# Patient Record
Sex: Female | Born: 1970 | Race: White | Hispanic: No | Smoking: Never smoker
Health system: Southern US, Community
[De-identification: ages and names within clinical notes are randomized; demographics above are authoritative.]

## PROBLEM LIST (undated history)

## (undated) DIAGNOSIS — K219 Gastro-esophageal reflux disease without esophagitis: Secondary | ICD-10-CM

## (undated) DIAGNOSIS — J45909 Unspecified asthma, uncomplicated: Secondary | ICD-10-CM

## (undated) DIAGNOSIS — G8929 Other chronic pain: Secondary | ICD-10-CM

## (undated) DIAGNOSIS — M25569 Pain in unspecified knee: Secondary | ICD-10-CM

## (undated) DIAGNOSIS — F319 Bipolar disorder, unspecified: Secondary | ICD-10-CM

## (undated) DIAGNOSIS — B009 Herpesviral infection, unspecified: Secondary | ICD-10-CM

## (undated) DIAGNOSIS — K259 Gastric ulcer, unspecified as acute or chronic, without hemorrhage or perforation: Secondary | ICD-10-CM

## (undated) DIAGNOSIS — M549 Dorsalgia, unspecified: Secondary | ICD-10-CM

## (undated) DIAGNOSIS — G43909 Migraine, unspecified, not intractable, without status migrainosus: Secondary | ICD-10-CM

## (undated) DIAGNOSIS — F419 Anxiety disorder, unspecified: Secondary | ICD-10-CM

## (undated) DIAGNOSIS — Z87440 Personal history of urinary (tract) infections: Secondary | ICD-10-CM

## (undated) HISTORY — PX: TUBAL LIGATION: SHX77

## (undated) HISTORY — PX: CHOLECYSTECTOMY: SHX55

## (undated) HISTORY — PX: KNEE SURGERY: SHX244

## (undated) HISTORY — PX: ABDOMINAL HYSTERECTOMY: SHX81

## (undated) HISTORY — PX: CERVICAL CONE BIOPSY: SUR198

## (undated) HISTORY — PX: INSERTION OF MESH: SHX5868

---

## 2015-01-30 ENCOUNTER — Emergency Department (HOSPITAL_COMMUNITY): Payer: Medicare Other

## 2015-01-30 ENCOUNTER — Encounter (HOSPITAL_COMMUNITY): Payer: Self-pay | Admitting: *Deleted

## 2015-01-30 ENCOUNTER — Emergency Department (HOSPITAL_COMMUNITY)
Admission: EM | Admit: 2015-01-30 | Discharge: 2015-01-30 | Disposition: A | Discharge status: Home / Self Care | Payer: Medicare Other | Attending: Emergency Medicine | Admitting: Emergency Medicine

## 2015-01-30 DIAGNOSIS — R079 Chest pain, unspecified: Secondary | ICD-10-CM | POA: Insufficient documentation

## 2015-01-30 DIAGNOSIS — Z880 Allergy status to penicillin: Secondary | ICD-10-CM | POA: Insufficient documentation

## 2015-01-30 DIAGNOSIS — Z8719 Personal history of other diseases of the digestive system: Secondary | ICD-10-CM | POA: Insufficient documentation

## 2015-01-30 DIAGNOSIS — Z8659 Personal history of other mental and behavioral disorders: Secondary | ICD-10-CM | POA: Insufficient documentation

## 2015-01-30 DIAGNOSIS — G8929 Other chronic pain: Secondary | ICD-10-CM | POA: Insufficient documentation

## 2015-01-30 DIAGNOSIS — Z8679 Personal history of other diseases of the circulatory system: Secondary | ICD-10-CM | POA: Diagnosis not present

## 2015-01-30 DIAGNOSIS — J45901 Unspecified asthma with (acute) exacerbation: Secondary | ICD-10-CM | POA: Insufficient documentation

## 2015-01-30 DIAGNOSIS — Z8619 Personal history of other infectious and parasitic diseases: Secondary | ICD-10-CM | POA: Insufficient documentation

## 2015-01-30 DIAGNOSIS — R42 Dizziness and giddiness: Secondary | ICD-10-CM | POA: Insufficient documentation

## 2015-01-30 HISTORY — DX: Dorsalgia, unspecified: M54.9

## 2015-01-30 HISTORY — DX: Other chronic pain: G89.29

## 2015-01-30 HISTORY — DX: Gastro-esophageal reflux disease without esophagitis: K21.9

## 2015-01-30 HISTORY — DX: Unspecified asthma, uncomplicated: J45.909

## 2015-01-30 HISTORY — DX: Pain in unspecified knee: M25.569

## 2015-01-30 HISTORY — DX: Anxiety disorder, unspecified: F41.9

## 2015-01-30 HISTORY — DX: Bipolar disorder, unspecified: F31.9

## 2015-01-30 HISTORY — DX: Herpesviral infection, unspecified: B00.9

## 2015-01-30 HISTORY — DX: Migraine, unspecified, not intractable, without status migrainosus: G43.909

## 2015-01-30 LAB — RAPID URINE DRUG SCREEN, HOSP PERFORMED
Amphetamines: NOT DETECTED
Barbiturates: NOT DETECTED
Benzodiazepines: NOT DETECTED
COCAINE: NOT DETECTED
OPIATES: NOT DETECTED
Tetrahydrocannabinol: NOT DETECTED

## 2015-01-30 LAB — CBC WITH DIFFERENTIAL/PLATELET
BASOS ABS: 0 10*3/uL (ref 0.0–0.1)
Basophils Relative: 0 % (ref 0–1)
EOS ABS: 0.1 10*3/uL (ref 0.0–0.7)
EOS PCT: 1 % (ref 0–5)
HCT: 41.9 % (ref 36.0–46.0)
Hemoglobin: 14.3 g/dL (ref 12.0–15.0)
Lymphocytes Relative: 27 % (ref 12–46)
Lymphs Abs: 1.8 10*3/uL (ref 0.7–4.0)
MCH: 31.3 pg (ref 26.0–34.0)
MCHC: 34.1 g/dL (ref 30.0–36.0)
MCV: 91.7 fL (ref 78.0–100.0)
MONO ABS: 0.7 10*3/uL (ref 0.1–1.0)
Monocytes Relative: 10 % (ref 3–12)
Neutro Abs: 4.1 10*3/uL (ref 1.7–7.7)
Neutrophils Relative %: 62 % (ref 43–77)
PLATELETS: 278 10*3/uL (ref 150–400)
RBC: 4.57 MIL/uL (ref 3.87–5.11)
RDW: 13.6 % (ref 11.5–15.5)
WBC: 6.7 10*3/uL (ref 4.0–10.5)

## 2015-01-30 LAB — COMPREHENSIVE METABOLIC PANEL
ALT: 19 U/L (ref 14–54)
ANION GAP: 9 (ref 5–15)
AST: 17 U/L (ref 15–41)
Albumin: 3.7 g/dL (ref 3.5–5.0)
Alkaline Phosphatase: 37 U/L — ABNORMAL LOW (ref 38–126)
BUN: 10 mg/dL (ref 6–20)
CALCIUM: 8.5 mg/dL — AB (ref 8.9–10.3)
CO2: 25 mmol/L (ref 22–32)
CREATININE: 0.72 mg/dL (ref 0.44–1.00)
Chloride: 103 mmol/L (ref 101–111)
GFR calc non Af Amer: >60 mL/min (ref >60)
Glucose, Bld: 89 mg/dL (ref 65–99)
Potassium: 3.6 mmol/L (ref 3.5–5.1)
Sodium: 137 mmol/L (ref 135–145)
TOTAL PROTEIN: 7 g/dL (ref 6.5–8.1)
Total Bilirubin: 0.6 mg/dL (ref 0.3–1.2)

## 2015-01-30 LAB — TROPONIN I: Troponin I: <0.03 ng/mL (ref <0.031)

## 2015-01-30 LAB — ETHANOL: Alcohol, Ethyl (B): <5 mg/dL (ref <5)

## 2015-01-30 NOTE — ED Notes (Addendum)
Pt c/o mid center chest pain that started yesterday after taking her dog outside, pt also c/o left side sharp chest pain that started over a month ago, pt reports that the pain is associated with sob, clammy feeling, pt states that she has had the sharp chest pain before and was placed on something for anxiety,

## 2015-01-30 NOTE — ED Provider Notes (Signed)
CSN: [id number]     Arrival date & time 01/30/15  2105 History  This chart was scribed for Zadie Rhine, MD by Octavia Heir, ED Scribe. This patient was seen in room APA15/APA15 and the patient's care was started at 10:35 PM.    Chief Complaint  Patient presents with  . Chest Pain    Patient is a 44 y.o. female presenting with chest pain. The history is provided by the patient. No language interpreter was used.  Chest Pain Pain location:  L chest Pain quality: shooting   Pain radiates to:  Does not radiate Pain radiates to the back: no   Pain severity:  Moderate Onset quality:  Gradual Duration:  2 months Timing:  Intermittent Progression:  Worsening Chronicity:  Recurrent Context: breathing   Relieved by:  Nothing Worsened by:  Deep breathing and exertion Ineffective treatments:  None tried Associated symptoms: anxiety, dizziness and shortness of breath   Associated symptoms: no fever and not vomiting     HPI Comments: Melanie Proctor is a 44 y.o. female who has a hx of anxiety presents to the Emergency Department complaining of chest pain onset 2 months ago. She notes having an associated cough, sob while ambulating, and light-headedness for 2 months. Pt also reports walking her dog yesterday and pulling her chest yesterday when he pulled away from her and this caused CP. She notes she has anxiety. She notes the last time she was seen for these symptoms was a few months ago. Pt notes having thoughts about hurting herself but has an appointment with a psychiatrist tomorrow. She denies SI and denies having plan at this time.  Pt denies fever, recent travel, vomiting, pain or swelling in legs, hx of MI, hx of blood clots.   Past Medical History  Diagnosis Date  . Anxiety   . GERD (gastroesophageal reflux disease)   . Migraine   . Herpes   . Back pain   . Asthma   . Knee pain, chronic   . Bipolar disorder    Past Surgical History  Procedure Laterality Date  . Knee surgery     . Cervical cone biopsy    . Insertion of mesh      vaginal  . Tubal ligation    . Abdominal hysterectomy    . Cholecystectomy     No family history on file. History  Substance Use Topics  . Smoking status: Never Smoker   . Smokeless tobacco: Not on file  . Alcohol Use: Yes     Comment: ocassional    OB History    No data available     Review of Systems  Constitutional: Negative for fever.  Respiratory: Positive for shortness of breath.   Cardiovascular: Positive for chest pain.  Gastrointestinal: Negative for vomiting.  Neurological: Positive for dizziness and light-headedness.  All other systems reviewed and are negative.     Allergies  Other; Penicillins; Sulfa antibiotics; Tape; and Topamax  Home Medications   Prior to Admission medications   Not on File   Triage vitals: BP 109/87 mmHg  Pulse 89  Temp(Src) 98.3 F (36.8 C) (Oral)  Resp 16  Ht  (1.626 m)  Wt 132 lb (59.875 kg)  BMI 22.65 kg/m2  SpO2 99% Physical Exam CONSTITUTIONAL: Well developed/well nourished HEAD: Normocephalic/atraumatic EYES: EOMI/PERRL ENMT: Mucous membranes moist NECK: supple no meningeal signs SPINE/BACK:entire spine nontender CV: S1/S2 noted, no murmurs/rubs/gallops noted Chest: left sided chest wall tenderness LUNGS: Lungs are clear to auscultation bilaterally,  no apparent distress ABDOMEN: soft, nontender, no rebound or guarding, bowel sounds noted throughout abdomen GU:no cva tenderness NEURO: Pt is awake/alert/appropriate, moves all extremitiesx4.  No facial droop.   EXTREMITIES: pulses normal/equal, full ROM, no LE edema or tenderness SKIN: warm, color normal PSYCH: no abnormalities of mood noted, alert and oriented to situation  ED Course  Procedures  DIAGNOSTIC STUDIES: Oxygen Saturation is 99% on RA, normal by my interpretation.  COORDINATION OF CARE:  10:43 PM Discussed treatment plan which includes follow up tomorrow with counselor with pt at bedside  and pt agreed to plan.   Pt admits this CP has been constant for 2 months, and she had reproducible CP on exam She is low risk for ACS I have low suspicion for PE (no hypoxia/tachycardia) I feel she can f/u as outpatient We discussed strict return precautions Also she denies SI and has f/u tomorrow  Labs Review Labs Reviewed  COMPREHENSIVE METABOLIC PANEL - Abnormal; Notable for the following:    Calcium 8.5 (*)    Alkaline Phosphatase 37 (*)    All other components within normal limits  CBC WITH DIFFERENTIAL/PLATELET  TROPONIN I  ETHANOL  URINE RAPID DRUG SCREEN, HOSP PERFORMED    Imaging Review Dg Chest 2 View  01/30/2015   CLINICAL DATA:  Patient with anterior chest pain after being pulled by dog.  EXAM: CHEST  2 VIEW  COMPARISON:  None.  FINDINGS: Monitoring leads overlie the patient. Normal cardiac and mediastinal contours. No consolidative pulmonary opacities. No pleural effusion or pneumothorax. Cholecystectomy clips. Regional skeleton is unremarkable.  IMPRESSION: No acute cardiopulmonary process.   Electronically Signed   By: Annia Belt M.D.   On: 01/30/2015 22:14     EKG Interpretation   Date/Time:  Wednesday January 30 2015 21:13:22 EDT Ventricular Rate:  87 PR Interval:  145 QRS Duration: 83 QT Interval:  374 QTC Calculation: 450 R Axis:   70 Text Interpretation:  Sinus rhythm No previous ECGs available Confirmed by  Manus Gunning  MD, STEPHEN [id number]) on 01/30/2015 9:34:35 PM      MDM   Final diagnoses:  Chest pain, unspecified chest pain type    Nursing notes including past medical history and social history reviewed and considered in documentation xrays/imaging reviewed by myself and considered during evaluation Labs/vital reviewed myself and considered during evaluation   I personally performed the services described in this documentation, which was scribed in my presence. The recorded information has been reviewed and is accurate.     Zadie Rhine,  MD 01/30/15 [id number]

## 2015-01-30 NOTE — Discharge Instructions (Signed)

## 2015-01-30 NOTE — ED Notes (Signed)
Pt states the a few days ago she had wanted to hurt herself, did have a plan of cutting herself, denies any SI or HI now, pt states that she was just angry, does have an appointment with [facility] tomorrow,

## 2015-02-22 ENCOUNTER — Emergency Department (HOSPITAL_COMMUNITY)
Admission: EM | Admit: 2015-02-22 | Discharge: 2015-02-22 | Disposition: A | Discharge status: Home / Self Care | Payer: Medicare Other | Attending: Emergency Medicine | Admitting: Emergency Medicine

## 2015-02-22 ENCOUNTER — Encounter (HOSPITAL_COMMUNITY): Payer: Self-pay | Admitting: Emergency Medicine

## 2015-02-22 DIAGNOSIS — F419 Anxiety disorder, unspecified: Secondary | ICD-10-CM | POA: Diagnosis not present

## 2015-02-22 DIAGNOSIS — Z793 Long term (current) use of hormonal contraceptives: Secondary | ICD-10-CM | POA: Diagnosis not present

## 2015-02-22 DIAGNOSIS — K219 Gastro-esophageal reflux disease without esophagitis: Secondary | ICD-10-CM | POA: Diagnosis not present

## 2015-02-22 DIAGNOSIS — F319 Bipolar disorder, unspecified: Secondary | ICD-10-CM | POA: Insufficient documentation

## 2015-02-22 DIAGNOSIS — R300 Dysuria: Secondary | ICD-10-CM | POA: Diagnosis present

## 2015-02-22 DIAGNOSIS — Z880 Allergy status to penicillin: Secondary | ICD-10-CM | POA: Diagnosis not present

## 2015-02-22 DIAGNOSIS — G43909 Migraine, unspecified, not intractable, without status migrainosus: Secondary | ICD-10-CM | POA: Insufficient documentation

## 2015-02-22 DIAGNOSIS — N3091 Cystitis, unspecified with hematuria: Secondary | ICD-10-CM | POA: Insufficient documentation

## 2015-02-22 DIAGNOSIS — Z8619 Personal history of other infectious and parasitic diseases: Secondary | ICD-10-CM | POA: Insufficient documentation

## 2015-02-22 DIAGNOSIS — J45909 Unspecified asthma, uncomplicated: Secondary | ICD-10-CM | POA: Insufficient documentation

## 2015-02-22 DIAGNOSIS — Z79899 Other long term (current) drug therapy: Secondary | ICD-10-CM | POA: Insufficient documentation

## 2015-02-22 DIAGNOSIS — G8929 Other chronic pain: Secondary | ICD-10-CM | POA: Diagnosis not present

## 2015-02-22 LAB — URINALYSIS, ROUTINE W REFLEX MICROSCOPIC
Bilirubin Urine: NEGATIVE
GLUCOSE, UA: NEGATIVE mg/dL
Ketones, ur: NEGATIVE mg/dL
Nitrite: NEGATIVE
PROTEIN: NEGATIVE mg/dL
Urobilinogen, UA: 0.2 mg/dL (ref 0.0–1.0)
pH: 5.5 (ref 5.0–8.0)

## 2015-02-22 LAB — URINE MICROSCOPIC-ADD ON

## 2015-02-22 MED ORDER — PHENAZOPYRIDINE HCL 100 MG PO TABS
200.0000 mg | ORAL_TABLET | Freq: Once | ORAL | Status: AC
  Administered 2015-02-22: 200 mg via ORAL
  Filled 2015-02-22: qty 2

## 2015-02-22 MED ORDER — PHENAZOPYRIDINE HCL 200 MG PO TABS: 200.0000 mg | ORAL_TABLET | Freq: Three times a day (TID) | ORAL | Status: DC

## 2015-02-22 MED ORDER — CEPHALEXIN 500 MG PO CAPS: 500.0000 mg | ORAL_CAPSULE | Freq: Four times a day (QID) | ORAL | Status: DC

## 2015-02-22 MED ORDER — CEPHALEXIN 500 MG PO CAPS
500.0000 mg | ORAL_CAPSULE | Freq: Once | ORAL | Status: AC
Start: 2015-02-22 — End: 2015-02-22
  Administered 2015-02-22: 500 mg via ORAL
  Filled 2015-02-22: qty 1

## 2015-02-22 NOTE — ED Provider Notes (Signed)
CSN: [id number]     Arrival date & time 02/22/15  1441 History   First MD Initiated Contact with Patient 02/22/15 1612     Chief Complaint  Patient presents with  . Dysuria     (Consider location/radiation/quality/duration/timing/severity/associated sxs/prior Treatment) Patient is a 44 y.o. female presenting with dysuria. The history is provided by the patient.  Dysuria Quality: pressure. Pain severity:  Moderate Onset quality:  Gradual Duration:  6 hours Timing:  Constant Progression:  Worsening Chronicity:  New Recent urinary tract infections: yes   Relieved by:  None tried Worsened by:  Nothing tried Ineffective treatments:  None tried Urinary symptoms: discolored urine, frequent urination and hematuria   Urinary symptoms: no bladder incontinence   Associated symptoms: abdominal pain   Associated symptoms: no fever, no genital lesions, no nausea and no vomiting    Melanie Proctor is a 44 y.o. female who presents to the ED with UTI symptoms that started earlier today. She has had UTI's in the past with the last one 2 months ago and this feels similar. She has lower abdominal pressure.  She has had a hysterectomy.   Past Medical History  Diagnosis Date  . Anxiety   . GERD (gastroesophageal reflux disease)   . Migraine   . Herpes   . Back pain   . Asthma   . Knee pain, chronic   . Bipolar disorder    Past Surgical History  Procedure Laterality Date  . Knee surgery    . Cervical cone biopsy    . Insertion of mesh      vaginal  . Tubal ligation    . Abdominal hysterectomy    . Cholecystectomy     History reviewed. No pertinent family history. History  Substance Use Topics  . Smoking status: Never Smoker   . Smokeless tobacco: Not on file  . Alcohol Use: Yes     Comment: ocassional    OB History    No data available     Review of Systems  Constitutional: Negative for fever.  Gastrointestinal: Positive for abdominal pain. Negative for nausea and vomiting.   Genitourinary: Positive for dysuria.  All other systems negative    Allergies  Other; Topamax; Penicillins; Sulfa antibiotics; and Tape  Home Medications   Prior to Admission medications   Medication Sig Start Date End Date Taking? Authorizing Provider  cephALEXin (KEFLEX) 500 MG capsule Take 1 capsule (500 mg total) by mouth 4 (four) times daily. 02/22/15   Hope Orlene OchM Neese, NP  citalopram (CELEXA) 20 MG tablet Take 10-20 mg by mouth daily. 01/03/15   Historical Provider, MD  clonazePAM (KLONOPIN) 0.5 MG tablet Take 0.5 mg by mouth 2 (two) times daily. 01/03/15   Historical Provider, MD  HYDROcodone-acetaminophen (NORCO/VICODIN) 5-325 MG per tablet Take 1 tablet by mouth every 8 (eight) hours as needed. for pain 01/11/15   Historical Provider, MD  ketoconazole (NIZORAL) 2 % shampoo Apply 1 application topically every other day. 01/11/15   Historical Provider, MD  Levonorgestrel-Ethinyl Estradiol (AMETHIA,CAMRESE) 0.1-0.02 & 0.01 MG tablet Take 1 tablet by mouth every morning. 01/03/15   Historical Provider, MD  minocycline (MINOCIN,DYNACIN) 100 MG capsule Take 100 mg by mouth once as needed (FOR ACNE).    Historical Provider, MD  omeprazole (PRILOSEC) 20 MG capsule Take 20 mg by mouth daily. 01/03/15   Historical Provider, MD  phenazopyridine (PYRIDIUM) 200 MG tablet Take 1 tablet (200 mg total) by mouth 3 (three) times daily. 02/22/15   Hope M  Damian Leavell, NP  rizatriptan (MAXALT) 10 MG tablet Take 10 mg by mouth as needed for migraine.  01/11/15   Historical Provider, MD  valACYclovir (VALTREX) 1000 MG tablet Take 1 g by mouth daily. 01/03/15   Historical Provider, MD   BP 116/72 mmHg  Pulse 86  Temp(Src) 98.4 F (36.9 C) (Oral)  Resp 14  Ht  (1.626 m)  Wt 129 lb (58.514 kg)  BMI 22.13 kg/m2  SpO2 100% Physical Exam  Constitutional: She is oriented to person, place, and time. She appears well-developed and well-nourished. No distress.  HENT:  Head: Normocephalic and atraumatic.  Eyes:  Conjunctivae and EOM are normal.  Neck: Normal range of motion. Neck supple.  Cardiovascular: Normal rate and regular rhythm.   Pulmonary/Chest: Effort normal and breath sounds normal.  Abdominal: Soft. Bowel sounds are normal. There is tenderness in the suprapubic area. There is no CVA tenderness.  Musculoskeletal: Normal range of motion.  Neurological: She is alert and oriented to person, place, and time. No cranial nerve deficit.  Skin: Skin is warm and dry.  Psychiatric: She has a normal mood and affect. Her behavior is normal.  Nursing note and vitals reviewed.   ED Course  Procedures (including critical care time) Labs Review Results for orders placed or performed during the hospital encounter of 02/22/15 (from the past 24 hour(s))  Urinalysis, Routine w reflex microscopic (not at [facility])     Status: Abnormal   Collection Time: 02/22/15  3:00 PM  Result Value Ref Range   Color, Urine YELLOW YELLOW   APPearance CLEAR CLEAR   Specific Gravity, Urine <1.005 (L) 1.005 - 1.030   pH 5.5 5.0 - 8.0   Glucose, UA NEGATIVE NEGATIVE mg/dL   Hgb urine dipstick LARGE (A) NEGATIVE   Bilirubin Urine NEGATIVE NEGATIVE   Ketones, ur NEGATIVE NEGATIVE mg/dL   Protein, ur NEGATIVE NEGATIVE mg/dL   Urobilinogen, UA 0.2 0.0 - 1.0 mg/dL   Nitrite NEGATIVE NEGATIVE   Leukocytes, UA MODERATE (A) NEGATIVE  Urine microscopic-add on     Status: Abnormal   Collection Time: 02/22/15  3:00 PM  Result Value Ref Range   Squamous Epithelial / LPF FEW (A) RARE   WBC, UA 21-50 <3 WBC/hpf   RBC / HPF 11-20 <3 RBC/hpf   Bacteria, UA FEW (A) RARE     MDM  44 y.o. female with UTI symptoms that started earlier today. Will treat with antibiotics and Pyridium. Stable for d/c without fever or signs of pyelo. She does not appear toxic. Discussed with the patient clinical and lab findings and all questioned fully answered. She will return if any problems arise.  Final diagnoses:  Cystitis with hematuria        [name], NP 02/22/15 1643  Benjiman Core, MD 02/23/15 [phone]

## 2015-02-22 NOTE — ED Notes (Signed)
Patient complaining of burning with urination and blood in urine starting this morning. Also complaining of lower abdominal pain.

## 2015-02-22 NOTE — Discharge Instructions (Signed)

## 2015-02-24 LAB — URINE CULTURE: Culture: 5000

## 2015-02-25 ENCOUNTER — Telehealth (HOSPITAL_BASED_OUTPATIENT_CLINIC_OR_DEPARTMENT_OTHER): Payer: Self-pay | Admitting: Emergency Medicine

## 2015-02-25 ENCOUNTER — Encounter: Payer: Medicare Other | Admitting: Cardiology

## 2015-02-25 NOTE — Telephone Encounter (Signed)
Post ED Visit - Positive Culture Follow-up  Culture report reviewed by antimicrobial stewardship pharmacist: []  Wes Dulaney, Pharm.D., BCPS [x]  Celedonio MiyamotoJeremy Frens, Pharm.D., BCPS []  Georgina PillionElizabeth Martin, Pharm.D., BCPS []  AmesMinh Pham, [address]Ph[address] BCPS, AAHIVP []  Estella HuskMichelle Turner, Pharm.D., BCPS, AAHIVP []  Elder CyphersLorie Poole, [address]Ph[address]D., BCPS  Positive urine culture Strep Treated with cephalexin, organism sensitive to the same and no further patient follow-up is required at this time.  Berle MullMiller, Rimsha Trembley 02/25/2015, 11:16 AM

## 2015-02-25 NOTE — Progress Notes (Signed)
Patient ID: Melanie Proctor, female   DOB: [birth year], 44 y.o.   MRN: [id number]     ERROR

## 2015-04-01 ENCOUNTER — Encounter (HOSPITAL_COMMUNITY): Payer: Self-pay | Admitting: *Deleted

## 2015-04-01 ENCOUNTER — Emergency Department (HOSPITAL_COMMUNITY)
Admission: EM | Admit: 2015-04-01 | Discharge: 2015-04-01 | Disposition: A | Discharge status: Home / Self Care | Payer: Medicare Other | Attending: Emergency Medicine | Admitting: Emergency Medicine

## 2015-04-01 DIAGNOSIS — K219 Gastro-esophageal reflux disease without esophagitis: Secondary | ICD-10-CM | POA: Insufficient documentation

## 2015-04-01 DIAGNOSIS — Z880 Allergy status to penicillin: Secondary | ICD-10-CM | POA: Diagnosis not present

## 2015-04-01 DIAGNOSIS — Y9389 Activity, other specified: Secondary | ICD-10-CM | POA: Diagnosis not present

## 2015-04-01 DIAGNOSIS — S3992XA Unspecified injury of lower back, initial encounter: Secondary | ICD-10-CM | POA: Diagnosis present

## 2015-04-01 DIAGNOSIS — Z8619 Personal history of other infectious and parasitic diseases: Secondary | ICD-10-CM | POA: Insufficient documentation

## 2015-04-01 DIAGNOSIS — F419 Anxiety disorder, unspecified: Secondary | ICD-10-CM | POA: Insufficient documentation

## 2015-04-01 DIAGNOSIS — S39012A Strain of muscle, fascia and tendon of lower back, initial encounter: Secondary | ICD-10-CM | POA: Diagnosis not present

## 2015-04-01 DIAGNOSIS — Y9289 Other specified places as the place of occurrence of the external cause: Secondary | ICD-10-CM | POA: Diagnosis not present

## 2015-04-01 DIAGNOSIS — G8929 Other chronic pain: Secondary | ICD-10-CM | POA: Insufficient documentation

## 2015-04-01 DIAGNOSIS — Y998 Other external cause status: Secondary | ICD-10-CM | POA: Insufficient documentation

## 2015-04-01 DIAGNOSIS — X58XXXA Exposure to other specified factors, initial encounter: Secondary | ICD-10-CM | POA: Diagnosis not present

## 2015-04-01 DIAGNOSIS — G43909 Migraine, unspecified, not intractable, without status migrainosus: Secondary | ICD-10-CM | POA: Diagnosis not present

## 2015-04-01 DIAGNOSIS — F319 Bipolar disorder, unspecified: Secondary | ICD-10-CM | POA: Insufficient documentation

## 2015-04-01 DIAGNOSIS — J45909 Unspecified asthma, uncomplicated: Secondary | ICD-10-CM | POA: Diagnosis not present

## 2015-04-01 DIAGNOSIS — Z79899 Other long term (current) drug therapy: Secondary | ICD-10-CM | POA: Diagnosis not present

## 2015-04-01 MED ORDER — ONDANSETRON 4 MG PO TBDP
4.0000 mg | ORAL_TABLET | Freq: Once | ORAL | Status: AC
  Administered 2015-04-01: 4 mg via ORAL
  Filled 2015-04-01: qty 1

## 2015-04-01 MED ORDER — METHOCARBAMOL 500 MG PO TABS: 1000.0000 mg | ORAL_TABLET | Freq: Three times a day (TID) | ORAL | Status: DC | PRN

## 2015-04-01 MED ORDER — ONDANSETRON HCL 4 MG PO TABS: 4.0000 mg | ORAL_TABLET | Freq: Three times a day (TID) | ORAL | Status: DC | PRN

## 2015-04-01 MED ORDER — METHOCARBAMOL 500 MG PO TABS
1000.0000 mg | ORAL_TABLET | Freq: Once | ORAL | Status: AC
  Administered 2015-04-01: 1000 mg via ORAL
  Filled 2015-04-01: qty 2

## 2015-04-01 NOTE — Discharge Instructions (Signed)
Back Pain, Adult °Low back pain is very common. About 1 in 5 people have back pain. The cause of low back pain is rarely dangerous. The pain often gets better over time. About half of people with a sudden onset of back pain feel better in just 2 weeks. About 8 in 10 people feel better by 6 weeks.  °CAUSES °Some common causes of back pain include: °· Strain of the muscles or ligaments supporting the spine. °· Wear and tear (degeneration) of the spinal discs. °· Arthritis. °· Direct injury to the back. °DIAGNOSIS °Most of the time, the direct cause of low back pain is not known. However, back pain can be treated effectively even when the exact cause of the pain is unknown. Answering your caregiver's questions about your overall health and symptoms is one of the most accurate ways to make sure the cause of your pain is not dangerous. If your caregiver needs more information, he or she may order lab work or imaging tests (X-rays or MRIs). However, even if imaging tests show changes in your back, this usually does not require surgery. °HOME CARE INSTRUCTIONS °For many people, back pain returns. Since low back pain is rarely dangerous, it is often a condition that people can learn to manage on their own.  °· Remain active. It is stressful on the back to sit or stand in one place. Do not sit, drive, or stand in one place for more than 30 minutes at a time. Take short walks on level surfaces as soon as pain allows. Try to increase the length of time you walk each day. °· Do not stay in bed. Resting more than 1 or 2 days can delay your recovery. °· Do not avoid exercise or work. Your body is made to move. It is not dangerous to be active, even though your back may hurt. Your back will likely heal faster if you return to being active before your pain is gone. °· Pay attention to your body when you  bend and lift. Many people have less discomfort when lifting if they bend their knees, keep the load close to their bodies, and  avoid twisting. Often, the most comfortable positions are those that put less stress on your recovering back. °· Find a comfortable position to sleep. Use a firm mattress and lie on your side with your knees slightly bent. If you lie on your back, put a pillow under your knees. °· Only take over-the-counter or prescription medicines as directed by your caregiver. Over-the-counter medicines to reduce pain and inflammation are often the most helpful. Your caregiver may prescribe muscle relaxant drugs. These medicines help dull your pain so you can more quickly return to your normal activities and healthy exercise. °· Put ice on the injured area. °¨ Put ice in a plastic bag. °¨ Place a towel between your skin and the bag. °¨ Leave the ice on for 15-20 minutes, 03-04 times a day for the first 2 to 3 days. After that, ice and heat may be alternated to reduce pain and spasms. °· Ask your caregiver about trying back exercises and gentle massage. This may be of some benefit. °· Avoid feeling anxious or stressed. Stress increases muscle tension and can worsen back pain. It is important to recognize when you are anxious or stressed and learn ways to manage it. Exercise is a great option. °SEEK MEDICAL CARE IF: °· You have pain that is not relieved with rest or medicine. °· You have pain that does not improve in 1 week. °· You have new symptoms. °· You are generally not feeling well. °SEEK   IMMEDIATE MEDICAL CARE IF:  °· You have pain that radiates from your back into your legs. °· You develop new bowel or bladder control problems. °· You have unusual weakness or numbness in your arms or legs. °· You develop nausea or vomiting. °· You develop abdominal pain. °· You feel faint. °Document Released: 07/27/2005 Document Revised: 01/26/2012 Document Reviewed: 11/28/2013 °[employer]® Patient Information ©2015 [employer]. This information is not intended to replace advice given to you by your health care provider. Make sure you  discuss any questions you have with your health care provider. °Muscle Strain °A muscle strain is an injury that occurs when a muscle is stretched beyond its normal length. Usually a small number of muscle fibers are torn when this happens. Muscle strain is rated in degrees. First-degree strains have the least amount of muscle fiber tearing and pain. Second-degree and third-degree strains have increasingly more tearing and pain.  °Usually, recovery from muscle strain takes 1-2 weeks. Complete healing takes 5-6 weeks.  °CAUSES  °Muscle strain happens when a sudden, violent force placed on a muscle stretches it too far. This may occur with lifting, sports, or a fall.  °RISK FACTORS °Muscle strain is especially common in athletes.  °SIGNS AND SYMPTOMS °At the site of the muscle strain, there may be: °· Pain. °· Bruising. °· Swelling. °· Difficulty using the muscle due to pain or lack of normal function. °DIAGNOSIS  °Your health care provider will perform a physical exam and ask about your medical history. °TREATMENT  °Often, the best treatment for a muscle strain is resting, icing, and applying cold compresses to the injured area.   °HOME CARE INSTRUCTIONS  °· Use the PRICE method of treatment to promote muscle healing during the first 2-3 days after your injury. The PRICE method involves: °¨ Protecting the muscle from being injured again. °¨ Restricting your activity and resting the injured body part. °¨ Icing your injury. To do this, put ice in a plastic bag. Place a towel between your skin and the bag. Then, apply the ice and leave it on from 15-20 minutes each hour. After the third day, switch to moist heat packs. °¨ Apply compression to the injured area with a splint or elastic bandage. Be careful not to wrap it too tightly. This may interfere with blood circulation or increase swelling. °¨ Elevate the injured body part above the level of your heart as often as you can. °· Only take over-the-counter or  prescription medicines for pain, discomfort, or fever as directed by your health care provider. °· Warming up prior to exercise helps to prevent future muscle strains. °SEEK MEDICAL CARE IF:  °· You have increasing pain or swelling in the injured area. °· You have numbness, tingling, or a significant loss of strength in the injured area. °MAKE SURE YOU:  °· Understand these instructions. °· Will watch your condition. °· Will get help right away if you are not doing well or get worse. °Document Released: 07/27/2005 Document Revised: 05/17/2013 Document Reviewed: 02/23/2013 °ExitCare® Patient Information ©2015 [employer]. This information is not intended to replace advice given to you by your health care provider. Make sure you discuss any questions you have with your health care provider. ° °

## 2015-04-01 NOTE — ED Notes (Signed)
Pt states she has 2 broken vertebrae in her back and tonight after she took her shower she noticed swelling and severe pain to her back

## 2015-04-01 NOTE — ED Provider Notes (Signed)
CSN: [id number]     Arrival date & time 04/01/15  0047 History   First MD Initiated Contact with Patient 04/01/15 0106     Chief Complaint  Patient presents with  . Back Pain     (Consider location/radiation/quality/duration/timing/severity/associated sxs/prior Treatment) HPI Patient presents with acute exacerbation of her chronic low back pain. States she was washing her large dog this afternoon and then started having worsening lower back pain this evening. Gradual onset. Mostly right-sided. Does not radiate down the leg. No focal weakness or numbness. No urinary symptoms associated with this. No fever or chills. Patient is currently in pain management for her lower back pain. 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 Has taken hydrocodone at home with little relief. States she cannot take NSAIDs due to reflux disease. Past Medical History  Diagnosis Date  . Anxiety   . GERD (gastroesophageal reflux disease)   . Migraine   . Herpes   . Back pain   . Asthma   . Knee pain, chronic   . Bipolar disorder    Past Surgical History  Procedure Laterality Date  . Knee surgery    . Cervical cone biopsy    . Insertion of mesh      vaginal  . Tubal ligation    . Abdominal hysterectomy    . Cholecystectomy     History reviewed. No pertinent family history. Social History  Substance Use Topics  . Smoking status: Never Smoker   . Smokeless tobacco: None  . Alcohol Use: Yes     Comment: ocassional    OB History    No data available     Review of Systems  Constitutional: Negative for fever and chills.  Genitourinary: Negative for dysuria and difficulty urinating.  Musculoskeletal: Positive for myalgias and back pain. Negative for arthralgias.  Skin: Negative for rash and wound.  Neurological: Negative for weakness and numbness.  All other systems reviewed and are negative.     Allergies  Other; Topamax; Penicillins; Sulfa antibiotics; and Tape  Home Medications    Prior to Admission medications   Medication Sig Start Date End Date Taking? Authorizing Provider  cephALEXin (KEFLEX) 500 MG capsule Take 1 capsule (500 mg total) by mouth 4 (four) times daily. 02/22/15   Hope Orlene Och, NP  citalopram (CELEXA) 20 MG tablet Take 10-20 mg by mouth daily. 01/03/15   Historical Provider, MD  clonazePAM (KLONOPIN) 0.5 MG tablet Take 0.5 mg by mouth 2 (two) times daily. 01/03/15   Historical Provider, MD  HYDROcodone-acetaminophen (NORCO/VICODIN) 5-325 MG per tablet Take 1 tablet by mouth every 8 (eight) hours as needed. for pain 01/11/15   Historical Provider, MD  ketoconazole (NIZORAL) 2 % shampoo Apply 1 application topically every other day. 01/11/15   Historical Provider, MD  Levonorgestrel-Ethinyl Estradiol (AMETHIA,CAMRESE) 0.1-0.02 & 0.01 MG tablet Take 1 tablet by mouth every morning. 01/03/15   Historical Provider, MD  methocarbamol (ROBAXIN) 500 MG tablet Take 2 tablets (1,000 mg total) by mouth every 8 (eight) hours as needed for muscle spasms. 04/01/15   Loren Racer, MD  minocycline (MINOCIN,DYNACIN) 100 MG capsule Take 100 mg by mouth once as needed (FOR ACNE).    Historical Provider, MD  omeprazole (PRILOSEC) 20 MG capsule Take 20 mg by mouth daily. 01/03/15   Historical Provider, MD  phenazopyridine (PYRIDIUM) 200 MG tablet Take 1 tablet (200 mg total) by mouth 3 (three) times daily. 02/22/15   Hope Orlene Och, NP  rizatriptan (MAXALT) 10 MG  tablet Take 10 mg by mouth as needed for migraine.  01/11/15   Historical Provider, MD  valACYclovir (VALTREX) 1000 MG tablet Take 1 g by mouth daily. 01/03/15   Historical Provider, MD   BP 147/89 mmHg  Pulse 81  Temp(Src) 97.6 F (36.4 C) (Oral)  Resp 20  Ht 5\' 4"  (1.626 m)  Wt 128 lb (58.06 kg)  BMI 21.96 kg/m2  SpO2 97% Physical Exam  Constitutional: She is oriented to person, place, and time. She appears well-developed and well-nourished. No distress.  HENT:  Head: Normocephalic and atraumatic.  Mouth/Throat:  Oropharynx is clear and moist.  Eyes: EOM are normal. Pupils are equal, round, and reactive to light.  Neck: Normal range of motion. Neck supple.  Cardiovascular: Normal rate and regular rhythm.   Pulmonary/Chest: Effort normal and breath sounds normal. No respiratory distress. She has no wheezes. She has no rales.  Abdominal: Soft. Bowel sounds are normal.  Musculoskeletal: Normal range of motion. She exhibits no edema or tenderness.  No lower extremity swelling or pain. Negative bilateral straight leg raise. Distal pulses intact. Patient has tenderness to palpation in the inferior lumbar paraspinal region right greater than left. No midline tenderness, deformity or step-offs. No evidence of trauma.  Neurological: She is alert and oriented to person, place, and time.  Skin: Skin is warm and dry. No rash noted. No erythema.  Psychiatric: She has a normal mood and affect. Her behavior is normal.  Nursing note and vitals reviewed.   ED Course  Procedures (including critical care time) Labs Review Labs Reviewed - No data to display  Imaging Review No results found. I have personally reviewed and evaluated these images and lab results as part of my medical decision-making.   EKG Interpretation None      MDM   Final diagnoses:  Lumbar strain, initial encounter    I personally performed the services described in this documentation, which was scribed in my presence. The recorded information has been reviewed and is accurate.  Patient presents with exacerbation of her chronic low back pain. No evidence of neurologic compromise. Do not believe that imaging is needed at this point. Treatment options are limited given patient's chronic narcotic use and intolerance of NSAIDs. We'll start on Robaxin. Patient is advised to follow-up with her primary physician. Return precautions given.    Loren Racer, MD 04/01/15 0120

## 2015-04-10 ENCOUNTER — Emergency Department (HOSPITAL_COMMUNITY)
Admission: EM | Admit: 2015-04-10 | Discharge: 2015-04-10 | Disposition: A | Discharge status: Home / Self Care | Payer: Medicare Other | Attending: Emergency Medicine | Admitting: Emergency Medicine

## 2015-04-10 ENCOUNTER — Encounter (HOSPITAL_COMMUNITY): Payer: Self-pay | Admitting: Emergency Medicine

## 2015-04-10 ENCOUNTER — Emergency Department (HOSPITAL_COMMUNITY): Payer: Medicare Other

## 2015-04-10 DIAGNOSIS — J45909 Unspecified asthma, uncomplicated: Secondary | ICD-10-CM | POA: Diagnosis not present

## 2015-04-10 DIAGNOSIS — K219 Gastro-esophageal reflux disease without esophagitis: Secondary | ICD-10-CM | POA: Diagnosis not present

## 2015-04-10 DIAGNOSIS — Z880 Allergy status to penicillin: Secondary | ICD-10-CM | POA: Insufficient documentation

## 2015-04-10 DIAGNOSIS — F319 Bipolar disorder, unspecified: Secondary | ICD-10-CM | POA: Diagnosis not present

## 2015-04-10 DIAGNOSIS — M25531 Pain in right wrist: Secondary | ICD-10-CM | POA: Diagnosis not present

## 2015-04-10 DIAGNOSIS — G43909 Migraine, unspecified, not intractable, without status migrainosus: Secondary | ICD-10-CM | POA: Insufficient documentation

## 2015-04-10 DIAGNOSIS — F419 Anxiety disorder, unspecified: Secondary | ICD-10-CM | POA: Diagnosis not present

## 2015-04-10 DIAGNOSIS — Z79899 Other long term (current) drug therapy: Secondary | ICD-10-CM | POA: Insufficient documentation

## 2015-04-10 DIAGNOSIS — Z8619 Personal history of other infectious and parasitic diseases: Secondary | ICD-10-CM | POA: Insufficient documentation

## 2015-04-10 DIAGNOSIS — G8929 Other chronic pain: Secondary | ICD-10-CM | POA: Diagnosis not present

## 2015-04-10 DIAGNOSIS — Z792 Long term (current) use of antibiotics: Secondary | ICD-10-CM | POA: Insufficient documentation

## 2015-04-10 NOTE — ED Provider Notes (Signed)
CSN: [id number]     Arrival date & time 04/10/15  1812 History   First MD Initiated Contact with Patient 04/10/15 1828     Chief Complaint  Patient presents with  . Wrist Pain     (Consider location/radiation/quality/duration/timing/severity/associated sxs/prior Treatment) Patient is a 44 y.o. female presenting with wrist pain. The history is provided by the patient.  Wrist Pain This is a new problem. The current episode started today. The problem occurs constantly. The problem has been gradually worsening.   Eliyana Proctor is a 44 y.o. female with hx of chronic pain presents to the ED with right wrist pain. She has a history of similar pain in the left wrist and has seen an orthopedic doctor. She describes the pain in the right wrist as burning and it increases with lifting things.   Past Medical History  Diagnosis Date  . Anxiety   . GERD (gastroesophageal reflux disease)   . Migraine   . Herpes   . Back pain   . Asthma   . Knee pain, chronic   . Bipolar disorder    Past Surgical History  Procedure Laterality Date  . Knee surgery    . Cervical cone biopsy    . Insertion of mesh      vaginal  . Tubal ligation    . Abdominal hysterectomy    . Cholecystectomy     No family history on file. Social History  Substance Use Topics  . Smoking status: Never Smoker   . Smokeless tobacco: None  . Alcohol Use: No     Comment: ocassional    OB History    No data available     Review of Systems Negative except as stated in HPI   Allergies  Other; Topamax; Penicillins; Sulfa antibiotics; and Tape  Home Medications   Prior to Admission medications   Medication Sig Start Date End Date Taking? Authorizing Provider  cephALEXin (KEFLEX) 500 MG capsule Take 1 capsule (500 mg total) by mouth 4 (four) times daily. 02/22/15   Mavi Un Orlene Och, NP  citalopram (CELEXA) 20 MG tablet Take 10-20 mg by mouth daily. 01/03/15   Historical Provider, MD  clonazePAM (KLONOPIN) 0.5 MG tablet Take  0.5 mg by mouth 2 (two) times daily. 01/03/15   Historical Provider, MD  HYDROcodone-acetaminophen (NORCO/VICODIN) 5-325 MG per tablet Take 1 tablet by mouth every 8 (eight) hours as needed. for pain 01/11/15   Historical Provider, MD  ketoconazole (NIZORAL) 2 % shampoo Apply 1 application topically every other day. 01/11/15   Historical Provider, MD  Levonorgestrel-Ethinyl Estradiol (AMETHIA,CAMRESE) 0.1-0.02 & 0.01 MG tablet Take 1 tablet by mouth every morning. 01/03/15   Historical Provider, MD  methocarbamol (ROBAXIN) 500 MG tablet Take 2 tablets (1,000 mg total) by mouth every 8 (eight) hours as needed for muscle spasms. 04/01/15   Loren Racer, MD  minocycline (MINOCIN,DYNACIN) 100 MG capsule Take 100 mg by mouth once as needed (FOR ACNE).    Historical Provider, MD  omeprazole (PRILOSEC) 20 MG capsule Take 20 mg by mouth daily. 01/03/15   Historical Provider, MD  ondansetron (ZOFRAN) 4 MG tablet Take 1 tablet (4 mg total) by mouth every 8 (eight) hours as needed for nausea or vomiting. 04/01/15   Loren Racer, MD  phenazopyridine (PYRIDIUM) 200 MG tablet Take 1 tablet (200 mg total) by mouth 3 (three) times daily. 02/22/15   Trek Kimball Orlene Och, NP  rizatriptan (MAXALT) 10 MG tablet Take 10 mg by mouth as needed for migraine.  01/11/15   Historical Provider, MD  valACYclovir (VALTREX) 1000 MG tablet Take 1 g by mouth daily. 01/03/15   Historical Provider, MD   BP 145/72 mmHg  Pulse 79  Temp(Src) 98.2 F (36.8 C) (Oral)  Resp 20  Ht  (1.626 m)  Wt 128 lb (58.06 kg)  BMI 21.96 kg/m2  SpO2 99% Physical Exam  Constitutional: She is oriented to person, place, and time. She appears well-developed and well-nourished. No distress.  HENT:  Head: Normocephalic.  Eyes: Conjunctivae and EOM are normal.  Neck: Neck supple.  Cardiovascular: Normal rate.   Pulmonary/Chest: Effort normal.  Musculoskeletal: Normal range of motion.       Right wrist: She exhibits tenderness. She exhibits normal range of  motion, no swelling, no crepitus, no deformity and no laceration.       Arms: Radial pulse 2+, adequate circulation, good touch sensation.   Neurological: She is alert and oriented to person, place, and time. She has normal strength. No cranial nerve deficit or sensory deficit.  Skin: Skin is warm and dry.  Psychiatric: She has a normal mood and affect. Her behavior is normal.  Nursing note and vitals reviewed.   ED Course  Procedures (including critical care time) Labs Review Labs Reviewed - No data to display  Imaging Review Dg Wrist Complete Right  04/10/2015   CLINICAL DATA:  Wrist injury while lifting TV yesterday. Wrist pain. Initial encounter.  EXAM: RIGHT WRIST - COMPLETE 3+ VIEW  COMPARISON:  None.  FINDINGS: There is no evidence of fracture or dislocation. There is no evidence of arthropathy or other focal bone abnormality. Soft tissues are unremarkable.  IMPRESSION: Negative.   Electronically Signed   By: Myles Rosenthal M.D.   On: 04/10/2015 18:52    MDM  44 y.o. female with right wrist pain stable for d/c without neurovascular compromise. Normal x-ray. Patient has pain medications at home. Wrist splint applied. She will follow up with her PCP or return here as needed.   Final diagnoses:  Wrist pain, right       [name], NP 04/11/15 0108  Samuel Jester, DO 04/13/15 1610

## 2015-04-10 NOTE — ED Notes (Signed)
Applied wrist splint to right hand, patient tolerated well.

## 2015-04-10 NOTE — ED Notes (Signed)
Right wrist pain, bruising, worse with lifting.

## 2015-04-17 ENCOUNTER — Emergency Department (HOSPITAL_COMMUNITY)
Admission: EM | Admit: 2015-04-17 | Discharge: 2015-04-17 | Disposition: A | Discharge status: Home / Self Care | Payer: Medicare Other | Attending: Emergency Medicine | Admitting: Emergency Medicine

## 2015-04-17 ENCOUNTER — Encounter (HOSPITAL_COMMUNITY): Payer: Self-pay | Admitting: *Deleted

## 2015-04-17 DIAGNOSIS — Z79899 Other long term (current) drug therapy: Secondary | ICD-10-CM | POA: Diagnosis not present

## 2015-04-17 DIAGNOSIS — F419 Anxiety disorder, unspecified: Secondary | ICD-10-CM | POA: Insufficient documentation

## 2015-04-17 DIAGNOSIS — G43909 Migraine, unspecified, not intractable, without status migrainosus: Secondary | ICD-10-CM | POA: Diagnosis not present

## 2015-04-17 DIAGNOSIS — Z793 Long term (current) use of hormonal contraceptives: Secondary | ICD-10-CM | POA: Insufficient documentation

## 2015-04-17 DIAGNOSIS — Z880 Allergy status to penicillin: Secondary | ICD-10-CM | POA: Insufficient documentation

## 2015-04-17 DIAGNOSIS — Z792 Long term (current) use of antibiotics: Secondary | ICD-10-CM | POA: Diagnosis not present

## 2015-04-17 DIAGNOSIS — J45909 Unspecified asthma, uncomplicated: Secondary | ICD-10-CM | POA: Diagnosis not present

## 2015-04-17 DIAGNOSIS — N760 Acute vaginitis: Secondary | ICD-10-CM | POA: Insufficient documentation

## 2015-04-17 DIAGNOSIS — K219 Gastro-esophageal reflux disease without esophagitis: Secondary | ICD-10-CM | POA: Diagnosis not present

## 2015-04-17 DIAGNOSIS — N898 Other specified noninflammatory disorders of vagina: Secondary | ICD-10-CM | POA: Diagnosis present

## 2015-04-17 DIAGNOSIS — Z8619 Personal history of other infectious and parasitic diseases: Secondary | ICD-10-CM | POA: Diagnosis not present

## 2015-04-17 DIAGNOSIS — F319 Bipolar disorder, unspecified: Secondary | ICD-10-CM | POA: Diagnosis not present

## 2015-04-17 DIAGNOSIS — G8929 Other chronic pain: Secondary | ICD-10-CM | POA: Diagnosis not present

## 2015-04-17 LAB — URINE MICROSCOPIC-ADD ON

## 2015-04-17 LAB — URINALYSIS, ROUTINE W REFLEX MICROSCOPIC
BILIRUBIN URINE: NEGATIVE
Glucose, UA: NEGATIVE mg/dL
Ketones, ur: 15 mg/dL — AB
Leukocytes, UA: NEGATIVE
NITRITE: NEGATIVE
Protein, ur: NEGATIVE mg/dL
SPECIFIC GRAVITY, URINE: 1.015 (ref 1.005–1.030)
UROBILINOGEN UA: 0.2 mg/dL (ref 0.0–1.0)
pH: 6 (ref 5.0–8.0)

## 2015-04-17 LAB — WET PREP, GENITAL
Clue Cells Wet Prep HPF POC: NONE SEEN
TRICH WET PREP: NONE SEEN

## 2015-04-17 NOTE — ED Provider Notes (Signed)
CSN: [id number]     Arrival date & time 04/17/15  1656 History   First MD Initiated Contact with Patient 04/17/15 1707     Chief Complaint  Patient presents with  . SEXUALLY TRANSMITTED DISEASE     (Consider location/radiation/quality/duration/timing/severity/associated sxs/prior Treatment) The history is provided by the patient.   Melanie Proctor is a 44 y.o. female with a past medical history significant for genital herpes on daily valtrex for suppression, presenting with a 2 week history of vaginal and perianal itching and mild increased vaginal discharge.  She denies abdominal or pelvic pain. Her boyfriend was recently diagnosed with genital warts and has she has noticed a small lesion on her left labia and is concerned for possibly being infected with warts as well.  He is currently undergoing tx for this with the health department. Pt denies fevers, chills, nausea, vomiting or other complaint.  She does endorse increasing vaginal dryness and frequent dyspareunia.     Past Medical History  Diagnosis Date  . Anxiety   . GERD (gastroesophageal reflux disease)   . Migraine   . Herpes   . Back pain   . Asthma   . Knee pain, chronic   . Bipolar disorder    Past Surgical History  Procedure Laterality Date  . Knee surgery    . Cervical cone biopsy    . Insertion of mesh      vaginal  . Tubal ligation    . Abdominal hysterectomy    . Cholecystectomy     No family history on file. Social History  Substance Use Topics  . Smoking status: Never Smoker   . Smokeless tobacco: None  . Alcohol Use: No     Comment: ocassional    OB History    No data available     Review of Systems  Constitutional: Negative for fever.  HENT: Negative for congestion and sore throat.   Eyes: Negative.   Respiratory: Negative for chest tightness and shortness of breath.   Cardiovascular: Negative for chest pain.  Gastrointestinal: Negative for nausea and abdominal pain.  Genitourinary: Positive  for vaginal discharge and genital sores. Negative for dysuria and vaginal pain.  Musculoskeletal: Negative for joint swelling, arthralgias and neck pain.  Skin: Negative.  Negative for rash and wound.  Neurological: Negative for dizziness, weakness, light-headedness, numbness and headaches.  Psychiatric/Behavioral: Negative.       Allergies  Other; Topamax; Penicillins; Sulfa antibiotics; and Tape  Home Medications   Prior to Admission medications   Medication Sig Start Date End Date Taking? Authorizing Provider  cephALEXin (KEFLEX) 500 MG capsule Take 1 capsule (500 mg total) by mouth 4 (four) times daily. 02/22/15   Hope Orlene Och, NP  citalopram (CELEXA) 20 MG tablet Take 10-20 mg by mouth daily. 01/03/15   Historical Provider, MD  clonazePAM (KLONOPIN) 0.5 MG tablet Take 0.5 mg by mouth 2 (two) times daily. 01/03/15   Historical Provider, MD  HYDROcodone-acetaminophen (NORCO/VICODIN) 5-325 MG per tablet Take 1 tablet by mouth every 8 (eight) hours as needed. for pain 01/11/15   Historical Provider, MD  ketoconazole (NIZORAL) 2 % shampoo Apply 1 application topically every other day. 01/11/15   Historical Provider, MD  Levonorgestrel-Ethinyl Estradiol (AMETHIA,CAMRESE) 0.1-0.02 & 0.01 MG tablet Take 1 tablet by mouth every morning. 01/03/15   Historical Provider, MD  methocarbamol (ROBAXIN) 500 MG tablet Take 2 tablets (1,000 mg total) by mouth every 8 (eight) hours as needed for muscle spasms. 04/01/15   Loren Racer,  MD  minocycline (MINOCIN,DYNACIN) 100 MG capsule Take 100 mg by mouth once as needed (FOR ACNE).    Historical Provider, MD  omeprazole (PRILOSEC) 20 MG capsule Take 20 mg by mouth daily. 01/03/15   Historical Provider, MD  ondansetron (ZOFRAN) 4 MG tablet Take 1 tablet (4 mg total) by mouth every 8 (eight) hours as needed for nausea or vomiting. 04/01/15   Loren Racer, MD  phenazopyridine (PYRIDIUM) 200 MG tablet Take 1 tablet (200 mg total) by mouth 3 (three) times daily.  02/22/15   Hope Orlene Och, NP  rizatriptan (MAXALT) 10 MG tablet Take 10 mg by mouth as needed for migraine.  01/11/15   Historical Provider, MD  valACYclovir (VALTREX) 1000 MG tablet Take 1 g by mouth daily. 01/03/15   Historical Provider, MD   BP 135/71 mmHg  Pulse 92  Temp(Src) 97.9 F (36.6 C) (Oral)  Resp 16  Ht  (1.626 m)  Wt 128 lb (58.06 kg)  BMI 21.96 kg/m2  SpO2 99% Physical Exam  Constitutional: She appears well-developed and well-nourished.  HENT:  Head: Normocephalic and atraumatic.  Eyes: Conjunctivae are normal.  Neck: Normal range of motion.  Cardiovascular: Normal rate, regular rhythm, normal heart sounds and intact distal pulses.   Pulmonary/Chest: Effort normal and breath sounds normal. She has no wheezes.  Abdominal: Soft. Bowel sounds are normal. There is no tenderness.  Genitourinary: Uterus normal. There is lesion on the left labia. Uterus is not tender. Cervix exhibits no motion tenderness and no discharge. There is erythema in the vagina.  Tiny raised, smooth skin colored pedunculated lesion left outer labia, approx 1 mm at its base.  Erythema along left vaginal wall.  Musculoskeletal: Normal range of motion.  Neurological: She is alert.  Skin: Skin is warm and dry.  Psychiatric: She has a normal mood and affect.  Nursing note and vitals reviewed.  Chaperone was present during exam.   ED Course  Procedures (including critical care time) Labs Review Labs Reviewed  WET PREP, GENITAL - Abnormal; Notable for the following:    Yeast Wet Prep HPF POC RARE (*)    WBC, Wet Prep HPF POC RARE (*)    All other components within normal limits  URINALYSIS, ROUTINE W REFLEX MICROSCOPIC (NOT AT [facility]) - Abnormal; Notable for the following:    Hgb urine dipstick LARGE (*)    Ketones, ur 15 (*)    All other components within normal limits  URINE MICROSCOPIC-ADD ON - Abnormal; Notable for the following:    Squamous Epithelial / LPF FEW (*)    Bacteria, UA FEW (*)     Crystals SULFONAMIDE CRYSTALS (*)    All other components within normal limits  RPR  HIV ANTIBODY (ROUTINE TESTING)  GC/CHLAMYDIA PROBE AMP ([place]) NOT AT [facility]    Imaging Review No results found. I have personally reviewed and evaluated these images and lab results as part of my medical decision-making.   EKG Interpretation None      MDM   Final diagnoses:  Vaginitis    Pt with tiny external labial lesion appearance of skin tag, doubt this is a genital wart lesion.  However, she was referred to gyn for further evaluation, pt to call for appt.  She was advised trying vagisil for vaginal irritation.  She does have hematuria, no dysuria, suspect this is vaginal wall irritation contamination.  Cultures pending.    Burgess Amor, PA-C 04/18/15 1613  Mancel Bale, MD 04/28/15 [id number]

## 2015-04-17 NOTE — ED Notes (Signed)
Pt states anal itching x 2 weeks. Also states that her boyfriend recently found out he has genital warts. Pt states she has noticed a place on left labia and wants it checked.

## 2015-04-17 NOTE — Discharge Instructions (Signed)

## 2015-04-18 LAB — HIV ANTIBODY (ROUTINE TESTING W REFLEX): HIV SCREEN 4TH GENERATION: NONREACTIVE

## 2015-04-18 LAB — RPR: RPR Ser Ql: NONREACTIVE

## 2015-04-18 LAB — GC/CHLAMYDIA PROBE AMP (~~LOC~~) NOT AT ARMC
Chlamydia: NEGATIVE
Neisseria Gonorrhea: NEGATIVE

## 2015-05-02 ENCOUNTER — Encounter (HOSPITAL_COMMUNITY): Payer: Self-pay | Admitting: Emergency Medicine

## 2015-05-02 ENCOUNTER — Emergency Department (HOSPITAL_COMMUNITY)
Admission: EM | Admit: 2015-05-02 | Discharge: 2015-05-02 | Disposition: A | Discharge status: Home / Self Care | Payer: Medicare Other | Attending: Emergency Medicine | Admitting: Emergency Medicine

## 2015-05-02 DIAGNOSIS — J45909 Unspecified asthma, uncomplicated: Secondary | ICD-10-CM | POA: Insufficient documentation

## 2015-05-02 DIAGNOSIS — Z760 Encounter for issue of repeat prescription: Secondary | ICD-10-CM | POA: Insufficient documentation

## 2015-05-02 DIAGNOSIS — K219 Gastro-esophageal reflux disease without esophagitis: Secondary | ICD-10-CM | POA: Insufficient documentation

## 2015-05-02 DIAGNOSIS — Z8619 Personal history of other infectious and parasitic diseases: Secondary | ICD-10-CM | POA: Diagnosis not present

## 2015-05-02 DIAGNOSIS — G8929 Other chronic pain: Secondary | ICD-10-CM | POA: Insufficient documentation

## 2015-05-02 DIAGNOSIS — Z880 Allergy status to penicillin: Secondary | ICD-10-CM | POA: Insufficient documentation

## 2015-05-02 DIAGNOSIS — F319 Bipolar disorder, unspecified: Secondary | ICD-10-CM | POA: Insufficient documentation

## 2015-05-02 DIAGNOSIS — Z793 Long term (current) use of hormonal contraceptives: Secondary | ICD-10-CM | POA: Insufficient documentation

## 2015-05-02 DIAGNOSIS — Z79899 Other long term (current) drug therapy: Secondary | ICD-10-CM | POA: Diagnosis not present

## 2015-05-02 DIAGNOSIS — N390 Urinary tract infection, site not specified: Secondary | ICD-10-CM | POA: Insufficient documentation

## 2015-05-02 DIAGNOSIS — F419 Anxiety disorder, unspecified: Secondary | ICD-10-CM | POA: Insufficient documentation

## 2015-05-02 DIAGNOSIS — G43909 Migraine, unspecified, not intractable, without status migrainosus: Secondary | ICD-10-CM | POA: Diagnosis not present

## 2015-05-02 DIAGNOSIS — R300 Dysuria: Secondary | ICD-10-CM | POA: Diagnosis present

## 2015-05-02 LAB — URINE MICROSCOPIC-ADD ON

## 2015-05-02 LAB — URINALYSIS, ROUTINE W REFLEX MICROSCOPIC
Bilirubin Urine: NEGATIVE
GLUCOSE, UA: NEGATIVE mg/dL
KETONES UR: NEGATIVE mg/dL
Nitrite: NEGATIVE
PH: 5.5 (ref 5.0–8.0)
Protein, ur: NEGATIVE mg/dL
SPECIFIC GRAVITY, URINE: 1.02 (ref 1.005–1.030)
Urobilinogen, UA: 0.2 mg/dL (ref 0.0–1.0)

## 2015-05-02 MED ORDER — PHENAZOPYRIDINE HCL 100 MG PO TABS
200.0000 mg | ORAL_TABLET | Freq: Once | ORAL | Status: AC
  Administered 2015-05-02: 200 mg via ORAL
  Filled 2015-05-02: qty 2

## 2015-05-02 MED ORDER — RIZATRIPTAN BENZOATE 10 MG PO TABS: 10.0000 mg | ORAL_TABLET | ORAL | Status: AC | PRN

## 2015-05-02 MED ORDER — NITROFURANTOIN MONOHYD MACRO 100 MG PO CAPS: 100.0000 mg | ORAL_CAPSULE | Freq: Two times a day (BID) | ORAL | Status: DC

## 2015-05-02 MED ORDER — KETOCONAZOLE 2 % EX SHAM: 1.0000 "application " | MEDICATED_SHAMPOO | CUTANEOUS | Status: AC

## 2015-05-02 MED ORDER — METHOCARBAMOL 500 MG PO TABS: 1000.0000 mg | ORAL_TABLET | Freq: Four times a day (QID) | ORAL | Status: AC

## 2015-05-02 MED ORDER — NITROFURANTOIN MONOHYD MACRO 100 MG PO CAPS
100.0000 mg | ORAL_CAPSULE | Freq: Once | ORAL | Status: AC
  Administered 2015-05-02: 100 mg via ORAL
  Filled 2015-05-02: qty 1

## 2015-05-02 MED ORDER — PHENAZOPYRIDINE HCL 200 MG PO TABS: 200.0000 mg | ORAL_TABLET | Freq: Three times a day (TID) | ORAL | Status: DC

## 2015-05-02 NOTE — Discharge Instructions (Signed)
Medication Refill, Emergency Department We have refilled your medication today as a courtesy to you. It is best for your medical care, however, to take care of getting refills done through your primary caregiver's office. They have your records and can do a better job of follow-up than we can in the emergency department. On maintenance medications, we often only prescribe enough medications to get you by until you are able to see your regular caregiver. This is a more expensive way to refill medications. In the future, please plan for refills so that you will not have to use the emergency department for this. Thank you for your help. Your help allows [place] to better take care of the daily emergencies that enter our department. Document Released: 11/13/2003 Document Revised: 10/19/2011 Document Reviewed: 11/03/2013 [facility] Patient Information 2015 [place], [place]. This information is not intended to replace advice given to you by your health care provider. Make sure you discuss any questions you have with your health care provider.  Urinary Tract Infection A urinary tract infection (UTI) can occur any place along the urinary tract. The tract includes the kidneys, ureters, bladder, and urethra. A type of germ called bacteria often causes a UTI. UTIs are often helped with antibiotic medicine.  HOME CARE   If given, take antibiotics as told by your doctor. Finish them even if you start to feel better.  Drink enough fluids to keep your pee (urine) clear or pale yellow.  Avoid tea, drinks with caffeine, and bubbly (carbonated) drinks.  Pee often. Avoid holding your pee in for a long time.  Pee before and after having sex (intercourse).  Wipe from front to back after you poop (bowel movement) if you are a woman. Use each tissue only once. GET HELP RIGHT AWAY IF:   You have back pain.  You have lower belly (abdominal) pain.  You have chills.  You feel sick to your stomach (nauseous).  You  throw up (vomit).  Your burning or discomfort with peeing does not go away.  You have a fever.  Your symptoms are not better in 3 days. MAKE SURE YOU:   Understand these instructions.  Will watch your condition.  Will get help right away if you are not doing well or get worse. Document Released: 01/13/2008 Document Revised: 04/20/2012 Document Reviewed: 02/25/2012 [facility] Patient Information 2015 [place], [place]. This information is not intended to replace advice given to you by your health care provider. Make sure you discuss any questions you have with your health care provider.

## 2015-05-02 NOTE — ED Notes (Signed)
Patient complaining of burning and urgency with urination starting approximately 30 minutes ago. Also complaining of abdominal pain that started at same time. Patient has history of UTI.

## 2015-05-03 ENCOUNTER — Encounter (HOSPITAL_COMMUNITY): Payer: Self-pay | Admitting: *Deleted

## 2015-05-03 ENCOUNTER — Emergency Department (HOSPITAL_COMMUNITY)
Admission: EM | Admit: 2015-05-03 | Discharge: 2015-05-04 | Disposition: A | Discharge status: Home / Self Care | Payer: Medicare Other | Attending: Emergency Medicine | Admitting: Emergency Medicine

## 2015-05-03 DIAGNOSIS — G8929 Other chronic pain: Secondary | ICD-10-CM | POA: Diagnosis not present

## 2015-05-03 DIAGNOSIS — Z880 Allergy status to penicillin: Secondary | ICD-10-CM | POA: Diagnosis not present

## 2015-05-03 DIAGNOSIS — Z79899 Other long term (current) drug therapy: Secondary | ICD-10-CM | POA: Insufficient documentation

## 2015-05-03 DIAGNOSIS — F319 Bipolar disorder, unspecified: Secondary | ICD-10-CM | POA: Insufficient documentation

## 2015-05-03 DIAGNOSIS — F419 Anxiety disorder, unspecified: Secondary | ICD-10-CM | POA: Diagnosis not present

## 2015-05-03 DIAGNOSIS — Z8619 Personal history of other infectious and parasitic diseases: Secondary | ICD-10-CM | POA: Insufficient documentation

## 2015-05-03 DIAGNOSIS — R109 Unspecified abdominal pain: Secondary | ICD-10-CM | POA: Insufficient documentation

## 2015-05-03 DIAGNOSIS — J45909 Unspecified asthma, uncomplicated: Secondary | ICD-10-CM | POA: Diagnosis not present

## 2015-05-03 DIAGNOSIS — K219 Gastro-esophageal reflux disease without esophagitis: Secondary | ICD-10-CM | POA: Insufficient documentation

## 2015-05-03 DIAGNOSIS — G43009 Migraine without aura, not intractable, without status migrainosus: Secondary | ICD-10-CM | POA: Diagnosis not present

## 2015-05-03 DIAGNOSIS — R51 Headache: Secondary | ICD-10-CM | POA: Diagnosis present

## 2015-05-03 MED ORDER — METOCLOPRAMIDE HCL 5 MG/ML IJ SOLN
10.0000 mg | Freq: Once | INTRAMUSCULAR | Status: AC
Start: 2015-05-03 — End: 2015-05-04
  Administered 2015-05-04: 10 mg via INTRAVENOUS
  Filled 2015-05-03: qty 2

## 2015-05-03 MED ORDER — DEXAMETHASONE SODIUM PHOSPHATE 10 MG/ML IJ SOLN
10.0000 mg | Freq: Once | INTRAMUSCULAR | Status: AC
  Administered 2015-05-04: 10 mg via INTRAVENOUS
  Filled 2015-05-03: qty 1

## 2015-05-03 MED ORDER — DIPHENHYDRAMINE HCL 50 MG/ML IJ SOLN
25.0000 mg | Freq: Once | INTRAMUSCULAR | Status: AC
  Administered 2015-05-04: 25 mg via INTRAVENOUS
  Filled 2015-05-03: qty 1

## 2015-05-03 MED ORDER — SODIUM CHLORIDE 0.9 % IV BOLUS (SEPSIS)
1000.0000 mL | Freq: Once | INTRAVENOUS | Status: AC
  Administered 2015-05-04: 1000 mL via INTRAVENOUS

## 2015-05-03 NOTE — ED Notes (Signed)
MD at the bedside  

## 2015-05-03 NOTE — ED Provider Notes (Signed)
CSN: [id number]     Arrival date & time 05/03/15  2302 History  This chart was scribed for Devoria Albe, MD by Phillis Haggis, ED Scribe. This patient was seen in room APA10/APA10 and patient care was started at 11:33 PM.   Chief Complaint  Patient presents with  . Headache   The history is provided by the patient. No language interpreter was used.  HPI Comments: Melanie Proctor is a 44 y.o. Female with a hx of headaches (tension and cluster) who presents to the Emergency Department complaining of generalized, throbbing, constant, gradually worsening headache onset 5 hours ago. Pt states that she went to pick up a prescription at a drug store and received her flu shot there at the same time. Pt states that she began to have shooting left sided neck pain that radiated up into her left head then resulted in a generalized headache about 1 hours after the immunization. Pt reports associated photophobia, noise sensitivity, nausea and neck pain and points to an area just inferior to her left ear. Reports hx of similar symptoms which are typically localized to one area, but states that this headache is more painful and is throbbing "like my head is going to explode" and all over. Reports that she has received the flu vaccine in the past without symptoms. 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 Denies hx of smoking or drinking, fever, chills, vomiting, numbness or tingling in her extremities.   PCP: Dr Stevie Kern in [place]   Past Medical History  Diagnosis Date  . Anxiety   . GERD (gastroesophageal reflux disease)   . Migraine   . Herpes   . Back pain   . Asthma   . Knee pain, chronic   . Bipolar disorder    Past Surgical History  Procedure Laterality Date  . Knee surgery    . Cervical cone biopsy    . Insertion of mesh      vaginal  . Tubal ligation    . Abdominal hysterectomy    .  Cholecystectomy     No family history on file. Social History  Substance Use Topics  . Smoking status: Never Smoker   . Smokeless tobacco: None  . Alcohol Use: No     Comment: ocassional    On disability  OB History    No data available     Review of Systems  Eyes: Positive for photophobia.  Gastrointestinal: Positive for nausea and abdominal pain.  Neurological: Positive for headaches.  All other systems reviewed and are negative.  Allergies  Amoxicillin; Azithromycin; Other; Topamax; Penicillins; Sulfa antibiotics; and Tape  Home Medications   Prior to Admission medications   Medication Sig Start Date End Date Taking? Authorizing Provider  cephALEXin (KEFLEX) 500 MG capsule Take 1 capsule (500 mg total) by mouth 4 (four) times daily. Patient not taking: Reported on 05/02/2015 02/22/15   [name], NP  citalopram (CELEXA) 20 MG tablet Take 10-20 mg by mouth daily. 01/03/15   Historical Provider, MD  clonazePAM (KLONOPIN) 0.5 MG tablet Take 0.5 mg by mouth daily.  01/03/15   Historical Provider, MD  HYDROcodone-acetaminophen (NORCO/VICODIN) 5-325 MG per tablet Take 1 tablet by mouth every 8 (eight) hours as needed. for pain 01/11/15   Historical Provider, MD  ketoconazole (NIZORAL) 2 % shampoo Apply 1 application topically 2 (two) times a week. 05/02/15   Burgess Amor, PA-C  Levonorgestrel-Ethinyl Estradiol (AMETHIA,CAMRESE) 0.1-0.02 & 0.01 MG tablet Take 1 tablet by mouth every morning. 01/03/15   Historical Provider, MD  methocarbamol (ROBAXIN) 500 MG tablet Take 2 tablets (1,000 mg total) by mouth 4 (four) times daily. 05/02/15 05/12/15  Burgess Amor, PA-C  minocycline (MINOCIN,DYNACIN) 100 MG capsule Take 100 mg by mouth once as needed (FOR ACNE).    Historical Provider, MD  nitrofurantoin, macrocrystal-monohydrate, (MACROBID) 100 MG capsule Take 1 capsule (100 mg total) by mouth 2 (two) times daily. 05/02/15   Burgess Amor, PA-C  omeprazole (PRILOSEC) 20 MG capsule Take 20 mg by mouth  daily. 01/03/15   Historical Provider, MD  ondansetron (ZOFRAN) 4 MG tablet Take 1 tablet (4 mg total) by mouth every 8 (eight) hours as needed for nausea or vomiting. Patient not taking: Reported on 05/02/2015 04/01/15   Loren Racer, MD  phenazopyridine (PYRIDIUM) 200 MG tablet Take 1 tablet (200 mg total) by mouth 3 (three) times daily. 05/02/15   Burgess Amor, PA-C  rizatriptan (MAXALT) 10 MG tablet Take 1 tablet (10 mg total) by mouth as needed for migraine. May repeat in 2 hours if needed 05/02/15   Burgess Amor, PA-C  valACYclovir (VALTREX) 1000 MG tablet Take 1 g by mouth daily. 01/03/15   Historical Provider, MD   BP 130/75 mmHg  Pulse 98  Temp(Src) 98.5 F (36.9 C) (Oral)  Resp 18  Ht  (1.626 m)  Wt 128 lb (58.06 kg)  BMI 21.96 kg/m2  SpO2 98%  Vital signs normal   Physical Exam  Constitutional: She is oriented to person, place, and time. She appears well-developed and well-nourished.  Non-toxic appearance. She does not appear ill. No distress.  Appears to have photophobia  HENT:  Head: Normocephalic and atraumatic.  Right Ear: External ear normal.  Left Ear: External ear normal.  Nose: Nose normal. No mucosal edema or rhinorrhea.  Mouth/Throat: Oropharynx is clear and moist and mucous membranes are normal. No dental abscesses or uvula swelling.  Eyes: Conjunctivae and EOM are normal. Pupils are equal, round, and reactive to light.  Neck: Normal range of motion and full passive range of motion without pain. Neck supple.  Cardiovascular: Normal rate, regular rhythm and normal heart sounds.  Exam reveals no gallop and no friction rub.   No murmur heard. Pulmonary/Chest: Effort normal and breath sounds normal. No respiratory distress. She has no wheezes. She has no rhonchi. She has no rales. She exhibits no tenderness and no crepitus.  Abdominal: Soft. Normal appearance and bowel sounds are normal. She exhibits no distension. There is no tenderness. There is no rebound and no  guarding.  Musculoskeletal: Normal range of motion. She exhibits no edema or tenderness.  Moves all extremities well.   Neurological: She is alert and oriented to person, place, and time. She has normal strength. No cranial nerve deficit.  Skin: Skin is warm, dry and intact. No rash noted. No erythema. No pallor.  Psychiatric: She has a normal mood and affect. Her speech is normal and behavior is normal. Her mood appears not anxious.  Nursing note and vitals reviewed.   ED Course  Procedures (including critical care time)  Medications  prochlorperazine (COMPAZINE) injection 10 mg (10 mg Intravenous Given 05/04/15 0520)  ketorolac (TORADOL) 30 MG/ML injection 30 mg (not administered)  sodium chloride 0.9 % bolus 1,000 mL (0 mLs Intravenous Stopped 05/04/15 0110)  metoCLOPramide (REGLAN) injection 10 mg (10 mg Intravenous Given 05/04/15 0006)  diphenhydrAMINE (BENADRYL) injection 25 mg (25 mg  Intravenous Given 05/04/15 0008)  dexamethasone (DECADRON) injection 10 mg (10 mg Intravenous Given 05/04/15 0004)  magnesium sulfate IVPB 2 g 50 mL (0 g Intravenous Stopped 05/04/15 0210)  valproate (DEPACON) 1,000 mg in dextrose 5 % 50 mL IVPB (0 g Intravenous Stopped 05/04/15 0446)    DIAGNOSTIC STUDIES: Oxygen Saturation is 98% on RA, normal by my interpretation.    COORDINATION OF CARE: 11:37 PM-Discussed treatment plan which includes migraine cocktail with IVs via IV with pt at bedside and pt agreed to plan.   Recheck at 1:45 AM. Patient has had IV fluids, IV Reglan and Benadryl with Decadron. She states she's only "minimally" better. Magnesium 2 g over 10 minutes was ordered.  Recheck at 3 AM. Patient again states she's not much better. She states her pain is a "8-1/2". Depacon 1000 mg was ordered.  Recheck 4:45 AM she states she's not any better. States her pain is a "9".  Compazine IV was ordered.  Recheck at 6 AM. Patient states her headache is starting to improve. She was given Toradol and  at this point she felt like she could go home.  Review of the [place] shows patient gets #90 hydrocodone 5/325 monthly from Dr. Odis Luster in [place]. It was last filled on September 3. She also gets #60 clonazepam 0.5 mg tablets monthly however on September 6 she only got 30 tablets instead of her usual 60.    MDM   Final diagnoses:  Migraine without aura and without status migrainosus, not intractable    Plan discharge  Devoria Albe, MD, FACEP   I personally performed the services described in this documentation, which was scribed in my presence. The recorded information has been reviewed and considered.  Devoria Albe, MD, Concha Pyo, MD 05/04/15 0630

## 2015-05-03 NOTE — ED Notes (Addendum)
Pt reporting headache starting this afternoon.  States that she got a flu shot today while picking up prescriptions for UTI she was seen and treated for last night.  Reporting some sensitivity to light and associated nausea.  Reports taking one dose of Tylenol this afternoon with no relief.

## 2015-05-04 DIAGNOSIS — G43009 Migraine without aura, not intractable, without status migrainosus: Secondary | ICD-10-CM | POA: Diagnosis not present

## 2015-05-04 MED ORDER — MAGNESIUM SULFATE 2 GM/50ML IV SOLN
2.0000 g | Freq: Once | INTRAVENOUS | Status: AC
  Administered 2015-05-04: 2 g via INTRAVENOUS
  Filled 2015-05-04: qty 50

## 2015-05-04 MED ORDER — KETOROLAC TROMETHAMINE 30 MG/ML IJ SOLN
30.0000 mg | Freq: Once | INTRAMUSCULAR | Status: AC
  Administered 2015-05-04: 30 mg via INTRAVENOUS
  Filled 2015-05-04: qty 1

## 2015-05-04 MED ORDER — PROCHLORPERAZINE EDISYLATE 5 MG/ML IJ SOLN
10.0000 mg | Freq: Four times a day (QID) | INTRAMUSCULAR | Status: DC | PRN
  Administered 2015-05-04: 10 mg via INTRAVENOUS
  Filled 2015-05-04: qty 2

## 2015-05-04 MED ORDER — DEXTROSE 5 % IV SOLN
1.0000 g | Freq: Once | INTRAVENOUS | Status: AC
  Administered 2015-05-04: 1000 mg via INTRAVENOUS
  Filled 2015-05-04: qty 10

## 2015-05-04 MED ORDER — VALPROATE SODIUM 500 MG/5ML IV SOLN
INTRAVENOUS | Status: AC
  Filled 2015-05-04: qty 10

## 2015-05-04 NOTE — ED Provider Notes (Signed)
CSN: [id number]     Arrival date & time 05/02/15  2105 History   First MD Initiated Contact with Patient 05/02/15 2126     Chief Complaint  Patient presents with  . Dysuria     (Consider location/radiation/quality/duration/timing/severity/associated sxs/prior Treatment) The history is provided by the patient.   Melanie Proctor is a 44 y.o. female with a medical history outlined below along with occasional urinary tract infections.  Approximately 30 minutes prior to arrival she noticed having sensation of suprapubic pressure, she went to urinate, had burning pain with micturition but continues to have the suprapubic pain, suggesting a new uti.  She denies low back pain, also no nausea, vomiting, fevers or flank pain.  She has had no hematuria but did have darker than normal urine prior to arrival.  She also reports having run out of several of her chronic medications including her maxalt, robaxin she uses for chronic back pain and ketoconazole shampoo for a chronic scalp condition.  She is in the process of establishing care with the health department for primary care.     Past Medical History  Diagnosis Date  . Anxiety   . GERD (gastroesophageal reflux disease)   . Migraine   . Herpes   . Back pain   . Asthma   . Knee pain, chronic   . Bipolar disorder    Past Surgical History  Procedure Laterality Date  . Knee surgery    . Cervical cone biopsy    . Insertion of mesh      vaginal  . Tubal ligation    . Abdominal hysterectomy    . Cholecystectomy     History reviewed. No pertinent family history. Social History  Substance Use Topics  . Smoking status: Never Smoker   . Smokeless tobacco: None  . Alcohol Use: No     Comment: ocassional    OB History    No data available     Review of Systems  Constitutional: Negative for fever and chills.  HENT: Negative for congestion and sore throat.   Eyes: Negative.   Respiratory: Negative for chest tightness and shortness of  breath.   Cardiovascular: Negative for chest pain.  Gastrointestinal: Negative for nausea, vomiting and abdominal pain.  Genitourinary: Positive for dysuria and urgency. Negative for hematuria, flank pain, vaginal discharge and pelvic pain.  Musculoskeletal: Negative for joint swelling, arthralgias and neck pain.  Skin: Negative.  Negative for rash and wound.  Neurological: Negative for dizziness, weakness, light-headedness, numbness and headaches.  Psychiatric/Behavioral: Negative.       Allergies  Amoxicillin; Azithromycin; Other; Topamax; Penicillins; Sulfa antibiotics; and Tape  Home Medications   Prior to Admission medications   Medication Sig Start Date End Date Taking? Authorizing Provider  citalopram (CELEXA) 20 MG tablet Take 10-20 mg by mouth daily. 01/03/15  Yes Historical Provider, MD  clonazePAM (KLONOPIN) 0.5 MG tablet Take 0.5 mg by mouth daily.  01/03/15  Yes Historical Provider, MD  HYDROcodone-acetaminophen (NORCO/VICODIN) 5-325 MG per tablet Take 1 tablet by mouth every 8 (eight) hours as needed. for pain 01/11/15  Yes Historical Provider, MD  Levonorgestrel-Ethinyl Estradiol (AMETHIA,CAMRESE) 0.1-0.02 & 0.01 MG tablet Take 1 tablet by mouth every morning. 01/03/15  Yes Historical Provider, MD  minocycline (MINOCIN,DYNACIN) 100 MG capsule Take 100 mg by mouth once as needed (FOR ACNE).   Yes Historical Provider, MD  omeprazole (PRILOSEC) 20 MG capsule Take 20 mg by mouth daily. 01/03/15  Yes Historical Provider, MD  valACYclovir (VALTREX) 1000  MG tablet Take 1 g by mouth daily. 01/03/15  Yes Historical Provider, MD  cephALEXin (KEFLEX) 500 MG capsule Take 1 capsule (500 mg total) by mouth 4 (four) times daily. Patient not taking: Reported on 05/02/2015 02/22/15   [name], NP  ketoconazole (NIZORAL) 2 % shampoo Apply 1 application topically 2 (two) times a week. 05/02/15   Burgess Amor, PA-C  methocarbamol (ROBAXIN) 500 MG tablet Take 2 tablets (1,000 mg total) by mouth 4  (four) times daily. 05/02/15 05/12/15  Burgess Amor, PA-C  nitrofurantoin, macrocrystal-monohydrate, (MACROBID) 100 MG capsule Take 1 capsule (100 mg total) by mouth 2 (two) times daily. 05/02/15   Burgess Amor, PA-C  ondansetron (ZOFRAN) 4 MG tablet Take 1 tablet (4 mg total) by mouth every 8 (eight) hours as needed for nausea or vomiting. Patient not taking: Reported on 05/02/2015 04/01/15   Loren Racer, MD  phenazopyridine (PYRIDIUM) 200 MG tablet Take 1 tablet (200 mg total) by mouth 3 (three) times daily. 05/02/15   Burgess Amor, PA-C  rizatriptan (MAXALT) 10 MG tablet Take 1 tablet (10 mg total) by mouth as needed for migraine. May repeat in 2 hours if needed 05/02/15   Burgess Amor, PA-C   BP 117/57 mmHg  Pulse 93  Temp(Src) 97.8 F (36.6 C) (Oral)  Resp 14  Ht 5' (1.524 m)  Wt 128 lb (58.06 kg)  BMI 25.00 kg/m2  SpO2 100% Physical Exam  Constitutional: She appears well-developed and well-nourished.  HENT:  Head: Normocephalic and atraumatic.  Eyes: Conjunctivae are normal.  Neck: Normal range of motion.  Cardiovascular: Normal rate, regular rhythm, normal heart sounds and intact distal pulses.   Pulmonary/Chest: Effort normal and breath sounds normal. She has no wheezes.  Abdominal: Soft. Bowel sounds are normal. She exhibits no distension. There is no tenderness. There is no guarding.  Musculoskeletal: Normal range of motion.  Neurological: She is alert.  Skin: Skin is warm and dry.  Psychiatric: She has a normal mood and affect.  Nursing note and vitals reviewed.   ED Course  Procedures (including critical care time) Labs Review Labs Reviewed  URINALYSIS, ROUTINE W REFLEX MICROSCOPIC (NOT AT [facility]) - Abnormal; Notable for the following:    APPearance CLOUDY (*)    Hgb urine dipstick LARGE (*)    Leukocytes, UA MODERATE (*)    All other components within normal limits  URINE MICROSCOPIC-ADD ON - Abnormal; Notable for the following:    Squamous Epithelial / LPF FEW (*)     Bacteria, UA MANY (*)    All other components within normal limits  URINE CULTURE    Imaging Review No results found. I have personally reviewed and evaluated these images and lab results as part of my medical decision-making.   EKG Interpretation None      MDM   Final diagnoses:  UTI (lower urinary tract infection)  Encounter for medication refill    Patients  labs reviewed interpreted and considered during the medical decision making and disposition process.   Results were also discussed with patient.    Urine cx ordered.  Pt was placed on macrobid, also given refill of her maxalt, nizoral shampoo and robaxin.  Advised f/u with pcp for repeat UA and f/u with pcp if sx persist or worsen.  The patient appears reasonably screened and/or stabilized for discharge and I doubt any other medical condition or other [facility] requiring further screening, evaluation, or treatment in the ED at this time prior to discharge.  Burgess Amor, PA-C 05/04/15 2011  Vanetta Mulders, MD 05/09/15 [phone]

## 2015-05-04 NOTE — ED Notes (Signed)
Patient given discharge instruction, verbalized understand. IV removed, band aid applied. Patient ambulatory out of the department.  

## 2015-05-04 NOTE — Discharge Instructions (Signed)
Go home and go to bed. Drink plenty of fluids. You can take ibuprofen 600 mg 4 times a day for body aches from the flu vaccine. Get the Maxalt filled today. Recheck if you get worse.    Recurrent Migraine Headache A migraine headache is an intense, throbbing pain on one or both sides of your head. Recurrent migraines keep coming back. A migraine can last for 30 minutes to several hours. CAUSES  The exact cause of a migraine headache is not always known. However, a migraine may be caused when nerves in the brain become irritated and release chemicals that cause inflammation. This causes pain. Certain things may also trigger migraines, such as:   Alcohol.  Smoking.  Stress.  Menstruation.  Aged cheeses.  Foods or drinks that contain nitrates, glutamate, aspartame, or tyramine.  Lack of sleep.  Chocolate.  Caffeine.  Hunger.  Physical exertion.  Fatigue.  Medicines used to treat chest pain (nitroglycerine), birth control pills, estrogen, and some blood pressure medicines. SYMPTOMS   Pain on one or both sides of your head.  Pulsating or throbbing pain.  Severe pain that prevents daily activities.  Pain that is aggravated by any physical activity.  Nausea, vomiting, or both.  Dizziness.  Pain with exposure to bright lights, loud noises, or activity.  General sensitivity to bright lights, loud noises, or smells. Before you get a migraine, you may get warning signs that a migraine is coming (aura). An aura may include:  Seeing flashing lights.  Seeing bright spots, halos, or zigzag lines.  Having tunnel vision or blurred vision.  Having feelings of numbness or tingling.  Having trouble talking.  Having muscle weakness. DIAGNOSIS  A recurrent migraine headache is often diagnosed based on:  Symptoms.  Physical examination.  A CT scan or MRI of your head. These imaging tests cannot diagnose migraines but can help rule out other causes of headaches.   TREATMENT  Medicines may be given for pain and nausea. Medicines can also be given to help prevent recurrent migraines. HOME CARE INSTRUCTIONS  Only take over-the-counter or prescription medicines for pain or discomfort as directed by your health care provider. The use of long-term narcotics is not recommended.  Lie down in a dark, quiet room when you have a migraine.  Keep a journal to find out what may trigger your migraine headaches. For example, write down:  What you eat and drink.  How much sleep you get.  Any change to your diet or medicines.  Limit alcohol consumption.  Quit smoking if you smoke.  Get 7-9 hours of sleep, or as recommended by your health care provider.  Limit stress.  Keep lights dim if bright lights bother you and make your migraines worse. SEEK MEDICAL CARE IF:   You do not get relief from the medicines given to you.  You have a recurrence of pain.  You have a fever. SEEK IMMEDIATE MEDICAL CARE IF:  Your migraine becomes severe.  You have a stiff neck.  You have loss of vision.  You have muscular weakness or loss of muscle control.  You start losing your balance or have trouble walking.  You feel faint or pass out.  You have severe symptoms that are different from your first symptoms. MAKE SURE YOU:   Understand these instructions.  Will watch your condition.  Will get help right away if you are not doing well or get worse. Document Released: 04/21/2001 Document Revised: 12/11/2013 Document Reviewed: 04/03/2013 Exit[employer] Patient Information 2015  [employer]. This information is not intended to replace advice given to you by your health care provider. Make sure you discuss any questions you have with your health care provider.

## 2015-05-05 LAB — URINE CULTURE: SPECIAL REQUESTS: NORMAL

## 2015-05-07 ENCOUNTER — Telehealth (HOSPITAL_COMMUNITY): Payer: Self-pay

## 2015-05-07 NOTE — Telephone Encounter (Signed)
Post ED Visit - Positive Culture Follow-up  Culture report reviewed by antimicrobial stewardship pharmacist:   Celedonio Miyamoto, Pharm.D., BCPS  Georgina Pillion, [address].D., BCPS  [place], [address].D., BCPS, AAHIVP  Estella Husk, Pharm.D., BCPS, AAHIVP  Cristy Reyes, [address].D.  Tennis Must, [place].D.  Positive Urine culture>/= 100,000 colonies -> E Coli Treated with Nitrofurantoin, organism sensitive to the same and no further patient follow-up is required at this time.  Arvid Right 05/07/2015, 5:04 AM

## 2015-05-27 ENCOUNTER — Emergency Department (HOSPITAL_COMMUNITY): Payer: Medicare Other

## 2015-05-27 ENCOUNTER — Emergency Department (HOSPITAL_COMMUNITY)
Admission: EM | Admit: 2015-05-27 | Discharge: 2015-05-28 | Disposition: A | Discharge status: Home / Self Care | Payer: Medicare Other | Attending: Emergency Medicine | Admitting: Emergency Medicine

## 2015-05-27 ENCOUNTER — Encounter (HOSPITAL_COMMUNITY): Payer: Self-pay | Admitting: *Deleted

## 2015-05-27 DIAGNOSIS — Z9049 Acquired absence of other specified parts of digestive tract: Secondary | ICD-10-CM | POA: Diagnosis not present

## 2015-05-27 DIAGNOSIS — Z793 Long term (current) use of hormonal contraceptives: Secondary | ICD-10-CM | POA: Insufficient documentation

## 2015-05-27 DIAGNOSIS — G8929 Other chronic pain: Secondary | ICD-10-CM | POA: Insufficient documentation

## 2015-05-27 DIAGNOSIS — Z880 Allergy status to penicillin: Secondary | ICD-10-CM | POA: Diagnosis not present

## 2015-05-27 DIAGNOSIS — Z90710 Acquired absence of both cervix and uterus: Secondary | ICD-10-CM | POA: Insufficient documentation

## 2015-05-27 DIAGNOSIS — K219 Gastro-esophageal reflux disease without esophagitis: Secondary | ICD-10-CM | POA: Diagnosis not present

## 2015-05-27 DIAGNOSIS — N12 Tubulo-interstitial nephritis, not specified as acute or chronic: Secondary | ICD-10-CM | POA: Insufficient documentation

## 2015-05-27 DIAGNOSIS — J45909 Unspecified asthma, uncomplicated: Secondary | ICD-10-CM | POA: Diagnosis not present

## 2015-05-27 DIAGNOSIS — G43909 Migraine, unspecified, not intractable, without status migrainosus: Secondary | ICD-10-CM | POA: Insufficient documentation

## 2015-05-27 DIAGNOSIS — Z9851 Tubal ligation status: Secondary | ICD-10-CM | POA: Diagnosis not present

## 2015-05-27 DIAGNOSIS — F419 Anxiety disorder, unspecified: Secondary | ICD-10-CM | POA: Insufficient documentation

## 2015-05-27 DIAGNOSIS — R109 Unspecified abdominal pain: Secondary | ICD-10-CM

## 2015-05-27 DIAGNOSIS — F319 Bipolar disorder, unspecified: Secondary | ICD-10-CM | POA: Diagnosis not present

## 2015-05-27 DIAGNOSIS — Z79899 Other long term (current) drug therapy: Secondary | ICD-10-CM | POA: Insufficient documentation

## 2015-05-27 DIAGNOSIS — Z8619 Personal history of other infectious and parasitic diseases: Secondary | ICD-10-CM | POA: Insufficient documentation

## 2015-05-27 HISTORY — DX: Gastric ulcer, unspecified as acute or chronic, without hemorrhage or perforation: K25.9

## 2015-05-27 LAB — URINE MICROSCOPIC-ADD ON

## 2015-05-27 LAB — CBC WITH DIFFERENTIAL/PLATELET
Basophils Absolute: 0 10*3/uL (ref 0.0–0.1)
Basophils Relative: 0 %
EOS ABS: 0.1 10*3/uL (ref 0.0–0.7)
EOS PCT: 1 %
HCT: 41.5 % (ref 36.0–46.0)
Hemoglobin: 14 g/dL (ref 12.0–15.0)
LYMPHS ABS: 2.8 10*3/uL (ref 0.7–4.0)
LYMPHS PCT: 27 %
MCH: 31 pg (ref 26.0–34.0)
MCHC: 33.7 g/dL (ref 30.0–36.0)
MCV: 91.8 fL (ref 78.0–100.0)
MONO ABS: 0.8 10*3/uL (ref 0.1–1.0)
MONOS PCT: 7 %
Neutro Abs: 6.8 10*3/uL (ref 1.7–7.7)
Neutrophils Relative %: 65 %
PLATELETS: 298 10*3/uL (ref 150–400)
RBC: 4.52 MIL/uL (ref 3.87–5.11)
RDW: 13.7 % (ref 11.5–15.5)
WBC: 10.5 10*3/uL (ref 4.0–10.5)

## 2015-05-27 LAB — BASIC METABOLIC PANEL
ANION GAP: 6 (ref 5–15)
BUN: 9 mg/dL (ref 6–20)
CALCIUM: 9 mg/dL (ref 8.9–10.3)
CO2: 25 mmol/L (ref 22–32)
Chloride: 108 mmol/L (ref 101–111)
Creatinine, Ser: 0.69 mg/dL (ref 0.44–1.00)
GLUCOSE: 100 mg/dL — AB (ref 65–99)
Potassium: 4 mmol/L (ref 3.5–5.1)
SODIUM: 139 mmol/L (ref 135–145)

## 2015-05-27 LAB — I-STAT BETA HCG BLOOD, ED (MC, WL, AP ONLY): I-stat hCG, quantitative: <5 m[IU]/mL

## 2015-05-27 LAB — URINALYSIS, ROUTINE W REFLEX MICROSCOPIC
GLUCOSE, UA: 250 mg/dL — AB
Nitrite: POSITIVE — AB
PROTEIN: 100 mg/dL — AB
Specific Gravity, Urine: 1.02 (ref 1.005–1.030)
Urobilinogen, UA: >8 mg/dL — ABNORMAL HIGH (ref 0.0–1.0)
pH: 5 (ref 5.0–8.0)

## 2015-05-27 MED ORDER — ONDANSETRON HCL 4 MG/2ML IJ SOLN
4.0000 mg | Freq: Once | INTRAMUSCULAR | Status: AC
  Administered 2015-05-27: 4 mg via INTRAVENOUS
  Filled 2015-05-27: qty 2

## 2015-05-27 MED ORDER — ONDANSETRON HCL 4 MG/2ML IJ SOLN: 4.0000 mg | Freq: Once | INTRAMUSCULAR | Status: DC

## 2015-05-27 MED ORDER — MORPHINE SULFATE (PF) 2 MG/ML IV SOLN
4.0000 mg | Freq: Once | INTRAVENOUS | Status: AC
  Administered 2015-05-27: 4 mg via INTRAVENOUS
  Filled 2015-05-27: qty 2

## 2015-05-27 MED ORDER — DEXTROSE 5 % IV SOLN
1.0000 g | Freq: Once | INTRAVENOUS | Status: AC
  Administered 2015-05-27: 1 g via INTRAVENOUS
  Filled 2015-05-27: qty 10

## 2015-05-27 MED ORDER — KETOROLAC TROMETHAMINE 30 MG/ML IJ SOLN
30.0000 mg | Freq: Once | INTRAMUSCULAR | Status: AC
  Administered 2015-05-27: 30 mg via INTRAVENOUS
  Filled 2015-05-27: qty 1

## 2015-05-27 NOTE — ED Provider Notes (Signed)
CSN: [id number]     Arrival date & time 05/27/15  2031 History   First MD Initiated Contact with Patient 05/27/15 2223     Chief Complaint  Patient presents with  . Back Pain     (Consider location/radiation/quality/duration/timing/severity/associated sxs/prior Treatment) Patient is a 44 y.o. female presenting with flank pain. The history is provided by the patient.  Flank Pain This is a new problem. The current episode started yesterday. The problem occurs intermittently. The problem has been gradually worsening. Associated symptoms include arthralgias and nausea. Pertinent negatives include no diaphoresis, fever, numbness, vomiting or weakness. Nothing aggravates the symptoms. She has tried nothing for the symptoms. The treatment provided no relief.    Past Medical History  Diagnosis Date  . Anxiety   . GERD (gastroesophageal reflux disease)   . Migraine   . Herpes   . Back pain   . Asthma   . Knee pain, chronic   . Bipolar disorder [facility])    Past Surgical History  Procedure Laterality Date  . Knee surgery    . Cervical cone biopsy    . Insertion of mesh      vaginal  . Tubal ligation    . Abdominal hysterectomy    . Cholecystectomy     History reviewed. No pertinent family history. Social History  Substance Use Topics  . Smoking status: Never Smoker   . Smokeless tobacco: None  . Alcohol Use: No     Comment: ocassional    OB History    No data available     Review of Systems  Constitutional: Negative for fever and diaphoresis.  Gastrointestinal: Positive for nausea. Negative for vomiting.  Genitourinary: Positive for frequency and flank pain.  Musculoskeletal: Positive for back pain and arthralgias.  Neurological: Negative for weakness and numbness.  Psychiatric/Behavioral: The patient is nervous/anxious.   All other systems reviewed and are negative.     Allergies  Amoxicillin; Azithromycin; Other; Topamax; Penicillins; Sulfa antibiotics; and  Tape  Home Medications   Prior to Admission medications   Medication Sig Start Date End Date Taking? Authorizing Provider  citalopram (CELEXA) 20 MG tablet Take 10-20 mg by mouth daily as needed (for panic attacks).  01/03/15  Yes Historical Provider, MD  clonazePAM (KLONOPIN) 0.5 MG tablet Take 0.5 mg by mouth daily.  01/03/15  Yes Historical Provider, MD  HYDROcodone-acetaminophen (NORCO/VICODIN) 5-325 MG per tablet Take 1 tablet by mouth every 8 (eight) hours as needed. for pain 01/11/15  Yes Historical Provider, MD  ketoconazole (NIZORAL) 2 % shampoo Apply 1 application topically 2 (two) times a week. 05/02/15  Yes Burgess Amor, PA-C  Levonorgestrel-Ethinyl Estradiol (AMETHIA,CAMRESE) 0.1-0.02 & 0.01 MG tablet Take 1 tablet by mouth every morning. 01/03/15  Yes Historical Provider, MD  omeprazole (PRILOSEC) 20 MG capsule Take 20 mg by mouth daily. 01/03/15  Yes Historical Provider, MD  phenazopyridine (PYRIDIUM) 200 MG tablet Take 1 tablet (200 mg total) by mouth 3 (three) times daily. 05/02/15  Yes Burgess Amor, PA-C  rizatriptan (MAXALT) 10 MG tablet Take 1 tablet (10 mg total) by mouth as needed for migraine. May repeat in 2 hours if needed 05/02/15  Yes Burgess Amor, PA-C  valACYclovir (VALTREX) 1000 MG tablet Take 1 g by mouth daily. 01/03/15  Yes Historical Provider, MD  cephALEXin (KEFLEX) 500 MG capsule Take 1 capsule (500 mg total) by mouth 4 (four) times daily. Patient not taking: Reported on 05/02/2015 02/22/15   [name], NP  nitrofurantoin, macrocrystal-monohydrate, (MACROBID) 100  MG capsule Take 1 capsule (100 mg total) by mouth 2 (two) times daily. Patient not taking: Reported on 05/27/2015 05/02/15   Burgess AmorJulie Idol, PA-C  ondansetron (ZOFRAN) 4 MG tablet Take 1 tablet (4 mg total) by mouth every 8 (eight) hours as needed for nausea or vomiting. Patient not taking: Reported on 05/02/2015 04/01/15   Loren Raceravid Yelverton, MD   BP 135/82 mmHg  Pulse 99  Temp(Src) 97.4 F (36.3 C) (Oral)  Resp 20  Ht  5\' 4"  (1.626 m)  Wt 130 lb (58.968 kg)  BMI 22.30 kg/m2  SpO2 97% Physical Exam  Constitutional: She is oriented to person, place, and time. She appears well-developed and well-nourished.  Non-toxic appearance.  HENT:  Head: Normocephalic.  Right Ear: Tympanic membrane and external ear normal.  Left Ear: Tympanic membrane and external ear normal.  Eyes: EOM and lids are normal. Pupils are equal, round, and reactive to light.  Neck: Normal range of motion. Neck supple. Carotid bruit is not present.  Cardiovascular: Normal rate, regular rhythm, normal heart sounds, intact distal pulses and normal pulses.   Pulmonary/Chest: Breath sounds normal. No respiratory distress.  Abdominal: Soft. Bowel sounds are normal. There is tenderness in the left lower quadrant. There is CVA tenderness. There is no guarding.  Lt CVAT present.  Musculoskeletal: Normal range of motion.  Lymphadenopathy:       Head (right side): No submandibular adenopathy present.       Head (left side): No submandibular adenopathy present.    She has no cervical adenopathy.  Neurological: She is alert and oriented to person, place, and time. She has normal strength. No cranial nerve deficit or sensory deficit.  Skin: Skin is warm and dry.  Psychiatric: She has a normal mood and affect. Her speech is normal.  Nursing note and vitals reviewed.   ED Course  Pt seen by DrLiu.  Procedures (including critical care time) Labs Review Labs Reviewed  CBC WITH DIFFERENTIAL/PLATELET  BASIC METABOLIC PANEL  URINALYSIS, ROUTINE W REFLEX MICROSCOPIC (NOT AT [facility])  I-STAT BETA HCG BLOOD, ED ([facility], [facility], [facility] ONLY)    Imaging Review No results found. I have personally reviewed and evaluated these images and lab results as part of my medical decision-making.   EKG Interpretation None      MDM Vital signs are well within normal limits. Urinalysis suggests urinary tract infection. The patient had a CT stone study that was negative  for kidney stone. There was noted a small amount of gas present suggestive of emphysematous cystitis. The patient is afebrile, she is awake and alert. Shows no evidence of being in shock. Does not appear to be in acute distress. The patient was treated in the emergency department with intravenous Robaxin. Urine culture was sent to the lab.  Patient was discharged by Dr.Liu and placed on Keflex. She has been given instructions to return to the emergency department immediately if any changes, problems, or concerns. The patient also has an appointment with urology to further address the emphysematous cystitis.    Final diagnoses:  None    **I have reviewed nursing notes, vital signs, and all appropriate lab and imaging results for this patient.Ivery Quale*    Winnell Bento, PA-C 05/29/15 2017  Lavera Guiseana Duo Liu, MD 06/03/15 1016

## 2015-05-27 NOTE — ED Provider Notes (Signed)
CSN: [id number]     Arrival date & time 05/27/15  2031 History  By signing my name below, I, Budd Palmer, attest that this documentation has been prepared under the direction and in the presence of Lavera Guise, MD. Electronically Signed: Budd Palmer, ED Scribe. 05/27/2015. 10:58 PM.    Chief Complaint  Patient presents with  . Back Pain   The history is provided by the patient. No language interpreter was used.   HPI Comments: Melanie Proctor is a 44 y.o. female who presents to the Emergency Department complaining of constant, left-sided flank pain radiating to the left abdomen onset 6.5 hours ago. She reports associated nausea, and diarrhea. She notes a PMHx of frequent bladder infections, with which she states she only had abdominal pain. She denies a PMHx of kidney stones. Pt denies vomiting, bloody stools, fever, chills, cough, congestion, sore throat, rhinorrhea, and vaginal bleeding or discharge.  Past Medical History  Diagnosis Date  . Anxiety   . GERD (gastroesophageal reflux disease)   . Migraine   . Herpes   . Back pain   . Asthma   . Knee pain, chronic   . Bipolar disorder (H[facility])   . Gastric ulcer    Past Surgical History  Procedure Laterality Date  . Knee surgery    . Cervical cone biopsy    . Insertion of mesh      vaginal  . Tubal ligation    . Abdominal hysterectomy    . Cholecystectomy     History reviewed. No pertinent family history. Social History  Substance Use Topics  . Smoking status: Never Smoker   . Smokeless tobacco: None  . Alcohol Use: No     Comment: ocassional    OB History    No data available     Review of Systems  Constitutional: Negative for fever and chills.  HENT: Negative for congestion, rhinorrhea and sore throat.   Respiratory: Negative for cough.   Gastrointestinal: Positive for nausea and diarrhea. Negative for vomiting and blood in stool.  Genitourinary: Negative for vaginal bleeding and vaginal discharge.   Musculoskeletal: Positive for back pain.  All other systems reviewed and are negative.   Allergies  Amoxicillin; Azithromycin; Other; Topamax; Penicillins; Sulfa antibiotics; and Tape  Home Medications   Prior to Admission medications   Medication Sig Start Date End Date Taking? Authorizing Provider  citalopram (CELEXA) 20 MG tablet Take 10-20 mg by mouth daily as needed (for panic attacks).  01/03/15  Yes Historical Provider, MD  clonazePAM (KLONOPIN) 0.5 MG tablet Take 0.5 mg by mouth daily.  01/03/15  Yes Historical Provider, MD  HYDROcodone-acetaminophen (NORCO/VICODIN) 5-325 MG per tablet Take 1 tablet by mouth every 8 (eight) hours as needed. for pain 01/11/15  Yes Historical Provider, MD  ketoconazole (NIZORAL) 2 % shampoo Apply 1 application topically 2 (two) times a week. 05/02/15  Yes Burgess Amor, PA-C  Levonorgestrel-Ethinyl Estradiol (AMETHIA,CAMRESE) 0.1-0.02 & 0.01 MG tablet Take 1 tablet by mouth every morning. 01/03/15  Yes Historical Provider, MD  omeprazole (PRILOSEC) 20 MG capsule Take 20 mg by mouth daily. 01/03/15  Yes Historical Provider, MD  phenazopyridine (PYRIDIUM) 200 MG tablet Take 1 tablet (200 mg total) by mouth 3 (three) times daily. 05/02/15  Yes Burgess Amor, PA-C  rizatriptan (MAXALT) 10 MG tablet Take 1 tablet (10 mg total) by mouth as needed for migraine. May repeat in 2 hours if needed 05/02/15  Yes Burgess Amor, PA-C  valACYclovir (VALTREX) 1000 MG tablet Take  1 g by mouth daily. 01/03/15  Yes Historical Provider, MD  cephALEXin (KEFLEX) 500 MG capsule Take 1 capsule (500 mg total) by mouth 4 (four) times daily. Patient not taking: Reported on 05/02/2015 02/22/15   Janne NapoleonHope M Neese, NP  cephALEXin (KEFLEX) 500 MG capsule Take 1 capsule (500 mg total) by mouth 4 (four) times daily. 05/28/15   Lavera Guiseana Duo Costas Sena, MD  HYDROcodone-acetaminophen (NORCO/VICODIN) 5-325 MG tablet Take 2 tablets by mouth every 4 (four) hours as needed. 05/28/15   Lavera Guiseana Duo Adilenne Ashworth, MD  nitrofurantoin,  macrocrystal-monohydrate, (MACROBID) 100 MG capsule Take 1 capsule (100 mg total) by mouth 2 (two) times daily. Patient not taking: Reported on 05/27/2015 05/02/15   Burgess AmorJulie Idol, PA-C  ondansetron (ZOFRAN) 4 MG tablet Take 1 tablet (4 mg total) by mouth every 8 (eight) hours as needed for nausea or vomiting. Patient not taking: Reported on 05/02/2015 04/01/15   Loren Raceravid Yelverton, MD  ondansetron (ZOFRAN) 4 MG tablet Take 1 tablet (4 mg total) by mouth every 8 (eight) hours as needed for nausea or vomiting. 05/28/15   Lavera Guiseana Duo Delonte Musich, MD   BP 134/75 mmHg  Pulse 89  Temp(Src) 98 F (36.7 C) (Oral)  Resp 20  Ht 5\' 4"  (1.626 m)  Wt 130 lb (58.968 kg)  BMI 22.30 kg/m2  SpO2 98% Physical Exam Physical Exam  Nursing note and vitals reviewed. Constitutional: Well developed, well nourished, non-toxic, and appears uncomfortable Head: Normocephalic and atraumatic.  Mouth/Throat: Oropharynx is clear and moist.  Neck: Normal range of motion. Neck supple.  Cardiovascular: Normal rate and regular rhythm.  No edema Pulmonary/Chest: Effort normal and breath sounds normal.  Abdominal: Soft. Some LLQ TTP. There is no rebound and no guarding. Left CVA tenderness Musculoskeletal: Normal range of motion.  Neurological: Alert, fluent speech, moves all extremities symmetrically Skin: Skin is warm and dry.  Psychiatric: Cooperative  ED Course  Procedures  DIAGNOSTIC STUDIES: Oxygen Saturation is 97% on RA, adequate by my interpretation.    COORDINATION OF CARE: 10:33 PM - Discussed possible kidney stone or infection. Discussed plans to order pain and anti-nausea medication, as well as diagnostic studies. Pt advised of plan for treatment and pt agrees.  Labs Review Labs Reviewed  BASIC METABOLIC PANEL - Abnormal; Notable for the following:    Glucose, Bld 100 (*)    All other components within normal limits  URINALYSIS, ROUTINE W REFLEX MICROSCOPIC (NOT AT [facility]) - Abnormal; Notable for the following:     Color, Urine ORANGE (*)    Glucose, UA 250 (*)    Hgb urine dipstick MODERATE (*)    Bilirubin Urine SMALL (*)    Ketones, ur TRACE (*)    Protein, ur 100 (*)    Urobilinogen, UA >8.0 (*)    Nitrite POSITIVE (*)    Leukocytes, UA SMALL (*)    All other components within normal limits  URINE MICROSCOPIC-ADD ON - Abnormal; Notable for the following:    Squamous Epithelial / LPF MANY (*)    Bacteria, UA MANY (*)    All other components within normal limits  URINE CULTURE  CBC WITH DIFFERENTIAL/PLATELET  I-STAT BETA HCG BLOOD, ED ([facility], [facility], [facility] ONLY)    Imaging Review Ct Renal Stone Study  05/28/2015  CLINICAL DATA:  Acute onset of left lower back pain, radiating to the left side of the abdomen. Nausea and diarrhea. Initial encounter. EXAM: CT ABDOMEN AND PELVIS WITHOUT CONTRAST TECHNIQUE: Multidetector CT imaging of the abdomen and pelvis was performed  following the standard protocol without IV contrast. COMPARISON:  None. FINDINGS: The visualized lung bases are clear. The liver and spleen are unremarkable in appearance. The patient is status post cholecystectomy, with clips noted at the gallbladder fossa. The pancreas and adrenal glands are unremarkable. The kidneys are unremarkable in appearance. There is no evidence of hydronephrosis. No renal or ureteral stones are seen. No perinephric stranding is appreciated. No free fluid is identified. The small bowel is unremarkable in appearance. The stomach is within normal limits. No acute vascular abnormalities are seen. The appendix is not well characterized. There is no evidence for appendicitis. The colon is unremarkable in appearance. The bladder is moderately distended. Trace air is noted within the bladder. Would correlate clinically for evidence of emphysematous cystitis. The patient is status post hysterectomy. No suspicious adnexal masses are seen. No inguinal lymphadenopathy is seen. No acute osseous abnormalities are identified. 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 IMPRESSION: 1. Trace air noted within the bladder. Would correlate clinically for evidence of emphysematous cystitis, or recent Foley catheter placement. 2. 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 Electronically Signed   By: Roanna Raider M.D.   On: 05/28/2015 00:06   I have personally reviewed and evaluated these images and lab results as part of my medical decision-making.   MDM   Final diagnoses:  Flank pain  Pyelonephritis    44 year old female with history of recurrent cystitis who presents with left flank pain and suprapubic fullness. Presentation is afebrile and hemodynamically stable. Appears uncomfortable but is well-appearing overall. Abdomen is soft and benign, with some left lower quadrant tenderness to palpation. Significant left CVA tenderness is noted. UA is suggestive of acute infection with moderate amount of hemoglobin. Given significant amount of flank pain that was not improved with Toradol and dose of morphine, there was concern for possible kidney stone. CT renal stone study was performed showing no evidence of urolithiasis. Tiny amount of gas noted within the bladder that may be suggestive of emphysematous cystitis. No recent urological procedure Foley catheter to explain this. Blood work without leukocytosis or kidney dysfunction. Given 1 g of ceftriaxone in the ED. On chart review, had urine culture in September 2016 showing sensitivity to ceftriaxone. Discussed findings with Dr. Sherron Monday from urology. 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 Patient given a course of Keflex. Strict return and follow-up instructions are reviewed. She expressed understanding of all discharge  instructions and felt comfortable with the plan of care.  I personally performed the services described in this documentation, which was scribed in my presence. The recorded information has been reviewed and is accurate.   Lavera Guise, MD 05/28/15 [phone]

## 2015-05-27 NOTE — ED Notes (Signed)
Pt c/o lower left back pain that radiates around to abdomen; pt states she has had some nausea and states she has diarrhea

## 2015-05-28 ENCOUNTER — Encounter (HOSPITAL_COMMUNITY): Payer: Self-pay | Admitting: *Deleted

## 2015-05-28 DIAGNOSIS — N12 Tubulo-interstitial nephritis, not specified as acute or chronic: Secondary | ICD-10-CM | POA: Diagnosis not present

## 2015-05-28 MED ORDER — CEPHALEXIN 500 MG PO CAPS: 500.0000 mg | ORAL_CAPSULE | Freq: Four times a day (QID) | ORAL | Status: DC

## 2015-05-28 MED ORDER — HYDROMORPHONE HCL 1 MG/ML IJ SOLN
1.0000 mg | Freq: Once | INTRAMUSCULAR | Status: AC
  Administered 2015-05-28: 1 mg via INTRAVENOUS
  Filled 2015-05-28: qty 1

## 2015-05-28 MED ORDER — ONDANSETRON HCL 4 MG PO TABS: 4.0000 mg | ORAL_TABLET | Freq: Three times a day (TID) | ORAL | Status: DC | PRN

## 2015-05-28 MED ORDER — HYDROCODONE-ACETAMINOPHEN 5-325 MG PO TABS: 2.0000 | ORAL_TABLET | ORAL | Status: DC | PRN

## 2015-05-28 MED ORDER — ONDANSETRON HCL 4 MG/2ML IJ SOLN
4.0000 mg | Freq: Once | INTRAMUSCULAR | Status: AC
  Administered 2015-05-28: 4 mg via INTRAVENOUS
  Filled 2015-05-28: qty 2

## 2015-05-28 MED ORDER — MORPHINE SULFATE (PF) 2 MG/ML IV SOLN
4.0000 mg | Freq: Once | INTRAVENOUS | Status: AC
  Administered 2015-05-28: 4 mg via INTRAVENOUS
  Filled 2015-05-28: qty 2

## 2015-05-28 NOTE — ED Notes (Signed)
Pt states that she continues to have abd cramping, pt requesting crackers and something to drink, per edp pt may be able to eat and drink,

## 2015-05-28 NOTE — ED Notes (Signed)
Dr. Liu at bedside,

## 2015-05-28 NOTE — ED Notes (Signed)
Pt states that the cramping has eased up "some"

## 2015-05-28 NOTE — ED Notes (Signed)
Pt states that the pain has not changed and now she is nauseous, Dr Verdie MosherLiu notified, additional orders given

## 2015-05-28 NOTE — Discharge Instructions (Signed)
Return for worsening symptoms, including fever, vomiting and unable to keep down food/fluids, worsening pain, or any other symptoms concerning to you.   Pyelonephritis, Adult Pyelonephritis is a kidney infection. The kidneys are organs that help clean your blood by moving waste out of your blood and into your pee (urine). This infection can happen quickly, or it can last for a long time. In most cases, it clears up with treatment and does not cause other problems. HOME CARE Medicines  Take over-the-counter and prescription medicines only as told by your doctor.  Take your antibiotic medicine as told by your doctor. Do not stop taking the medicine even if you start to feel better. General Instructions  Drink enough fluid to keep your pee clear or pale yellow.  Avoid caffeine, tea, and carbonated drinks.  Pee (urinate) often. Avoid holding in pee for long periods of time.  Pee before and after sex.  After pooping (having a bowel movement), women should wipe from front to back. Use each tissue only once.  Keep all follow-up visits as told by your doctor. This is important. GET HELP IF:  You do not feel better after 2 days.  Your symptoms get worse.  You have a fever. GET HELP RIGHT AWAY IF:  You cannot take your medicine or drink fluids as told.  You have chills and shaking.  You throw up (vomit).  You have very bad pain in your side (flank) or back.  You feel very weak or you pass out (faint).   This information is not intended to replace advice given to you by your health care provider. Make sure you discuss any questions you have with your health care provider.   Document Released: 09/03/2004 Document Revised: 04/17/2015 Document Reviewed: 11/19/2014 Elsevier Interactive Patient Education [employer]

## 2015-05-30 LAB — URINE CULTURE

## 2015-05-31 ENCOUNTER — Encounter (HOSPITAL_COMMUNITY): Payer: Self-pay | Admitting: *Deleted

## 2015-05-31 ENCOUNTER — Telehealth (HOSPITAL_COMMUNITY): Payer: Self-pay

## 2015-05-31 ENCOUNTER — Emergency Department (HOSPITAL_COMMUNITY)
Admission: EM | Admit: 2015-05-31 | Discharge: 2015-05-31 | Disposition: A | Discharge status: Home / Self Care | Payer: Medicare Other | Attending: Emergency Medicine | Admitting: Emergency Medicine

## 2015-05-31 DIAGNOSIS — G8929 Other chronic pain: Secondary | ICD-10-CM | POA: Insufficient documentation

## 2015-05-31 DIAGNOSIS — J45909 Unspecified asthma, uncomplicated: Secondary | ICD-10-CM | POA: Diagnosis not present

## 2015-05-31 DIAGNOSIS — F419 Anxiety disorder, unspecified: Secondary | ICD-10-CM | POA: Diagnosis not present

## 2015-05-31 DIAGNOSIS — G43909 Migraine, unspecified, not intractable, without status migrainosus: Secondary | ICD-10-CM | POA: Insufficient documentation

## 2015-05-31 DIAGNOSIS — Z793 Long term (current) use of hormonal contraceptives: Secondary | ICD-10-CM | POA: Insufficient documentation

## 2015-05-31 DIAGNOSIS — B373 Candidiasis of vulva and vagina: Secondary | ICD-10-CM

## 2015-05-31 DIAGNOSIS — K219 Gastro-esophageal reflux disease without esophagitis: Secondary | ICD-10-CM | POA: Diagnosis not present

## 2015-05-31 DIAGNOSIS — Z79899 Other long term (current) drug therapy: Secondary | ICD-10-CM | POA: Diagnosis not present

## 2015-05-31 DIAGNOSIS — F319 Bipolar disorder, unspecified: Secondary | ICD-10-CM | POA: Diagnosis not present

## 2015-05-31 DIAGNOSIS — Z880 Allergy status to penicillin: Secondary | ICD-10-CM | POA: Diagnosis not present

## 2015-05-31 DIAGNOSIS — Z792 Long term (current) use of antibiotics: Secondary | ICD-10-CM | POA: Insufficient documentation

## 2015-05-31 DIAGNOSIS — Z90710 Acquired absence of both cervix and uterus: Secondary | ICD-10-CM | POA: Diagnosis not present

## 2015-05-31 DIAGNOSIS — Z9049 Acquired absence of other specified parts of digestive tract: Secondary | ICD-10-CM | POA: Diagnosis not present

## 2015-05-31 DIAGNOSIS — R109 Unspecified abdominal pain: Secondary | ICD-10-CM

## 2015-05-31 DIAGNOSIS — M62830 Muscle spasm of back: Secondary | ICD-10-CM

## 2015-05-31 DIAGNOSIS — Z9851 Tubal ligation status: Secondary | ICD-10-CM | POA: Insufficient documentation

## 2015-05-31 DIAGNOSIS — B3731 Acute candidiasis of vulva and vagina: Secondary | ICD-10-CM

## 2015-05-31 DIAGNOSIS — Z87440 Personal history of urinary (tract) infections: Secondary | ICD-10-CM | POA: Insufficient documentation

## 2015-05-31 DIAGNOSIS — M545 Low back pain: Secondary | ICD-10-CM | POA: Diagnosis not present

## 2015-05-31 DIAGNOSIS — R1030 Lower abdominal pain, unspecified: Secondary | ICD-10-CM | POA: Diagnosis present

## 2015-05-31 LAB — URINE MICROSCOPIC-ADD ON

## 2015-05-31 LAB — WET PREP, GENITAL
CLUE CELLS WET PREP: NONE SEEN
TRICH WET PREP: NONE SEEN

## 2015-05-31 LAB — URINALYSIS, ROUTINE W REFLEX MICROSCOPIC
BILIRUBIN URINE: NEGATIVE
GLUCOSE, UA: NEGATIVE mg/dL
KETONES UR: NEGATIVE mg/dL
NITRITE: NEGATIVE
PH: 5.5 (ref 5.0–8.0)
PROTEIN: NEGATIVE mg/dL
Specific Gravity, Urine: <1.005 — ABNORMAL LOW (ref 1.005–1.030)
Urobilinogen, UA: 0.2 mg/dL (ref 0.0–1.0)

## 2015-05-31 MED ORDER — CYCLOBENZAPRINE HCL 10 MG PO TABS
10.0000 mg | ORAL_TABLET | Freq: Once | ORAL | Status: AC
  Administered 2015-05-31: 10 mg via ORAL
  Filled 2015-05-31: qty 1

## 2015-05-31 MED ORDER — FLUCONAZOLE 150 MG PO TABS: 150.0000 mg | ORAL_TABLET | Freq: Once | ORAL | Status: DC

## 2015-05-31 MED ORDER — FLUCONAZOLE 100 MG PO TABS
150.0000 mg | ORAL_TABLET | Freq: Once | ORAL | Status: AC
  Administered 2015-05-31: 150 mg via ORAL
  Filled 2015-05-31: qty 2

## 2015-05-31 MED ORDER — CYCLOBENZAPRINE HCL 10 MG PO TABS: 10.0000 mg | ORAL_TABLET | Freq: Two times a day (BID) | ORAL | Status: DC | PRN

## 2015-05-31 NOTE — ED Notes (Signed)
NP at bedside.

## 2015-05-31 NOTE — ED Notes (Signed)
Pt states that she was treated here recently for UTI, states she now thinks she has a yeast infection. States lower abdominal pain yesterday also. Pt also states, "My boyfriend also has a yeast infections with white discharge and itching also" She states her discharge brown discharge with visible blood.

## 2015-05-31 NOTE — Discharge Instructions (Signed)
Follow up with your primary care doctor as scheduled.  Do not drive while taking the muscle relaxant as it will make you sleepy.  Wait 2 days and then take the Diflucan

## 2015-05-31 NOTE — ED Provider Notes (Signed)
CSN: [id number]     Arrival date & time 05/31/15  1516 History   First MD Initiated Contact with Patient 05/31/15 1614     Chief Complaint  Patient presents with  . Pelvic Pain     (Consider location/radiation/quality/duration/timing/severity/associated sxs/prior Treatment) Patient is a 44 y.o. female presenting with vaginal discharge. The history is provided by the patient.  Vaginal Discharge Quality:  Manson Passey and bloody Severity:  Mild Onset quality:  Gradual Duration:  2 days Timing:  Constant Progression:  Worsening Chronicity:  New Context: recent antibiotic use   Relieved by:  Nothing Worsened by:  Nothing tried Ineffective treatments:  OTC medications Associated symptoms: abdominal pain and vaginal itching   Associated symptoms: no dysuria, no nausea, no urinary frequency and no urinary incontinence   Risk factors: unprotected sex    Melanie Proctor is a 44 y.o. female who presents to the ED with vaginal d/c. She reports taking antibiotics for a UTI and then the itching and d/c started. She reports her boyfriend has a white d/c from his penis and complains of itching. Yesterday she she began having lower abdominal cramping.  Patient has had a hysterectomy.   Patient also complains of left lower back spasm. She has a hx of fx of her lower back and has problems from time to time. The spasm started a couple days ago.   Past Medical History  Diagnosis Date  . Anxiety   . GERD (gastroesophageal reflux disease)   . Migraine   . Herpes   . Back pain   . Asthma   . Knee pain, chronic   . Bipolar disorder (HCC)   . Gastric ulcer    Past Surgical History  Procedure Laterality Date  . Knee surgery    . Cervical cone biopsy    . Insertion of mesh      vaginal  . Tubal ligation    . Abdominal hysterectomy    . Cholecystectomy     No family history on file. Social History  Substance Use Topics  . Smoking status: Never Smoker   . Smokeless tobacco: None  . Alcohol Use:  No     Comment: ocassional    OB History    No data available     Review of Systems  Gastrointestinal: Positive for abdominal pain. Negative for nausea.  Genitourinary: Positive for vaginal discharge. Negative for bladder incontinence and dysuria.  Musculoskeletal:       Muscle spasm of back  all other systems negative    Allergies  Amoxicillin; Azithromycin; Other; Topamax; Penicillins; Sulfa antibiotics; and Tape  Home Medications   Prior to Admission medications   Medication Sig Start Date End Date Taking? Authorizing Provider  cephALEXin (KEFLEX) 500 MG capsule Take 1 capsule (500 mg total) by mouth 4 (four) times daily. Patient not taking: Reported on 05/02/2015 02/22/15   [name], NP  cephALEXin (KEFLEX) 500 MG capsule Take 1 capsule (500 mg total) by mouth 4 (four) times daily. 05/28/15   Lavera Guise, MD  citalopram (CELEXA) 20 MG tablet Take 10-20 mg by mouth daily as needed (for panic attacks).  01/03/15   Historical Provider, MD  clonazePAM (KLONOPIN) 0.5 MG tablet Take 0.5 mg by mouth daily.  01/03/15   Historical Provider, MD  cyclobenzaprine (FLEXERIL) 10 MG tablet Take 1 tablet (10 mg total) by mouth 2 (two) times daily as needed for muscle spasms. 05/31/15   Emonni Depasquale Orlene Och, NP  fluconazole (DIFLUCAN) 150 MG tablet Take  1 tablet (150 mg total) by mouth once. 05/31/15   Kendrah Lovern Orlene OchM Thessaly Mccullers, NP  HYDROcodone-acetaminophen (NORCO/VICODIN) 5-325 MG per tablet Take 1 tablet by mouth every 8 (eight) hours as needed. for pain 01/11/15   Historical Provider, MD  HYDROcodone-acetaminophen (NORCO/VICODIN) 5-325 MG tablet Take 2 tablets by mouth every 4 (four) hours as needed. 05/28/15   Lavera Guiseana Duo Liu, MD  ketoconazole (NIZORAL) 2 % shampoo Apply 1 application topically 2 (two) times a week. 05/02/15   Burgess AmorJulie Idol, PA-C  Levonorgestrel-Ethinyl Estradiol (AMETHIA,CAMRESE) 0.1-0.02 & 0.01 MG tablet Take 1 tablet by mouth every morning. 01/03/15   Historical Provider, MD  nitrofurantoin,  macrocrystal-monohydrate, (MACROBID) 100 MG capsule Take 1 capsule (100 mg total) by mouth 2 (two) times daily. Patient not taking: Reported on 05/27/2015 05/02/15   Burgess AmorJulie Idol, PA-C  omeprazole (PRILOSEC) 20 MG capsule Take 20 mg by mouth daily. 01/03/15   Historical Provider, MD  ondansetron (ZOFRAN) 4 MG tablet Take 1 tablet (4 mg total) by mouth every 8 (eight) hours as needed for nausea or vomiting. Patient not taking: Reported on 05/02/2015 04/01/15   Loren Raceravid Yelverton, MD  ondansetron (ZOFRAN) 4 MG tablet Take 1 tablet (4 mg total) by mouth every 8 (eight) hours as needed for nausea or vomiting. 05/28/15   Lavera Guiseana Duo Liu, MD  phenazopyridine (PYRIDIUM) 200 MG tablet Take 1 tablet (200 mg total) by mouth 3 (three) times daily. 05/02/15   Burgess AmorJulie Idol, PA-C  rizatriptan (MAXALT) 10 MG tablet Take 1 tablet (10 mg total) by mouth as needed for migraine. May repeat in 2 hours if needed 05/02/15   Burgess AmorJulie Idol, PA-C  valACYclovir (VALTREX) 1000 MG tablet Take 1 g by mouth daily. 01/03/15   Historical Provider, MD   BP 127/69 mmHg  Pulse 91  Temp(Src) 98 F (36.7 C) (Oral)  Resp 16  Ht 5\' 4"  (1.626 m)  Wt 130 lb (58.968 kg)  BMI 22.30 kg/m2  SpO2 100% Physical Exam  Constitutional: She is oriented to person, place, and time. She appears well-developed and well-nourished. No distress.  HENT:  Head: Normocephalic and atraumatic.  Nose: Nose normal.  Eyes: Conjunctivae and EOM are normal.  Neck: Normal range of motion. Neck supple.  Cardiovascular: Normal rate and regular rhythm.   Pulmonary/Chest: Effort normal. She has no wheezes. She has no rales.  Abdominal: Soft. Bowel sounds are normal. There is generalized tenderness. There is no rebound, no guarding and no CVA tenderness.  Tenderness is mild  Genitourinary:  External genitalia without lesions, white d/c vaginal vault, cervix absent.   Musculoskeletal: Normal range of motion.       Lumbar back: She exhibits tenderness, pain and spasm. She  exhibits normal range of motion and normal pulse.       Back:  Spasm and tenderness  Neurological: She is alert and oriented to person, place, and time. She has normal strength. No cranial nerve deficit or sensory deficit. Gait normal.  Reflex Scores:      Bicep reflexes are 2+ on the right side and 2+ on the left side.      Brachioradialis reflexes are 2+ on the right side and 2+ on the left side.      Patellar reflexes are 2+ on the right side and 2+ on the left side.      Achilles reflexes are 2+ on the right side and 2+ on the left side. Skin: Skin is warm and dry.  Psychiatric: She has a normal mood and affect. Her  behavior is normal.  Nursing note and vitals reviewed.   ED Course  Procedures  Results for orders placed or performed during the hospital encounter of 05/31/15 (from the past 24 hour(s))  Urinalysis, Routine w reflex microscopic (not at [facility])     Status: Abnormal   Collection Time: 05/31/15  4:17 PM  Result Value Ref Range   Color, Urine YELLOW YELLOW   APPearance CLEAR CLEAR   Specific Gravity, Urine <1.005 (L) 1.005 - 1.030   pH 5.5 5.0 - 8.0   Glucose, UA NEGATIVE NEGATIVE mg/dL   Hgb urine dipstick TRACE (A) NEGATIVE   Bilirubin Urine NEGATIVE NEGATIVE   Ketones, ur NEGATIVE NEGATIVE mg/dL   Protein, ur NEGATIVE NEGATIVE mg/dL   Urobilinogen, UA 0.2 0.0 - 1.0 mg/dL   Nitrite NEGATIVE NEGATIVE   Leukocytes, UA TRACE (A) NEGATIVE  Urine microscopic-add on     Status: None   Collection Time: 05/31/15  4:17 PM  Result Value Ref Range   Squamous Epithelial / LPF RARE RARE   WBC, UA 0-2 <3 WBC/hpf   RBC / HPF 0-2 <3 RBC/hpf   Bacteria, UA RARE RARE   Urine-Other RARE YEAST   Wet prep, genital     Status: Abnormal   Collection Time: 05/31/15  4:25 PM  Result Value Ref Range   Yeast Wet Prep HPF POC FEW (A) NONE SEEN   Trich, Wet Prep NONE SEEN NONE SEEN   Clue Cells Wet Prep HPF POC NONE SEEN NONE SEEN   WBC, Wet Prep HPF POC FEW (A) NONE SEEN      Diflucan 150 mg PO here,   MDM  44 y.o. female with vaginal d/c and itching after finishing antibiotics for UTI, muscle spasm of the left lumbar area and abdominal cramping stable for d/c without acute abdomen. Urine today without signs of infection. Will treat for yeast and muscle spasm. She has an appointment to start care with a PCP the first of November. Stable for d/c without fever or focal neuro deficits and does not appear toxic.   Final diagnoses:  Monilial vaginitis  Spasm of lumbar paraspinous muscle  Abdominal cramping       [name], NP 06/01/15 0147  Samuel Jester, DO 06/04/15 1919

## 2015-05-31 NOTE — Telephone Encounter (Signed)
Post ED Visit - Positive Culture Follow-up  Culture report reviewed by antimicrobial stewardship pharmacist:  []  Celedonio MiyamotoJeremy Frens, Pharm.D., BCPS []  Georgina PillionElizabeth Martin, Pharm.D., BCPS []  AshlandMinh Pham, [address]Ph[address]D., BCPS, AAHIVP []  Estella HuskMichelle Turner, Pharm.D., BCPS, AAHIVP []  McKinneyristy Reyes, [address]Pharm[address] []  Tennis Mustassie Stewart, [place] Gary FleetX  Nicholas Gazda, Pharm.D.  Positive urine culture, >/= 100,000 colonies -> E Coli Treated with Cephalexin, organism sensitive to the same and no further patient follow-up is required at this time  Arvid RightClark, Demarri Elie Dorn 05/31/2015, 12:52 PM

## 2015-06-03 LAB — GC/CHLAMYDIA PROBE AMP (~~LOC~~) NOT AT ARMC
Chlamydia: NEGATIVE
NEISSERIA GONORRHEA: NEGATIVE

## 2015-08-13 ENCOUNTER — Encounter (HOSPITAL_COMMUNITY): Payer: Self-pay | Admitting: Emergency Medicine

## 2015-08-13 ENCOUNTER — Emergency Department (HOSPITAL_COMMUNITY)
Admission: EM | Admit: 2015-08-13 | Discharge: 2015-08-13 | Disposition: A | Discharge status: Home / Self Care | Payer: Medicare Other | Attending: Emergency Medicine | Admitting: Emergency Medicine

## 2015-08-13 ENCOUNTER — Emergency Department (HOSPITAL_COMMUNITY): Payer: Medicare Other

## 2015-08-13 DIAGNOSIS — Z880 Allergy status to penicillin: Secondary | ICD-10-CM | POA: Insufficient documentation

## 2015-08-13 DIAGNOSIS — F419 Anxiety disorder, unspecified: Secondary | ICD-10-CM | POA: Diagnosis not present

## 2015-08-13 DIAGNOSIS — Z8619 Personal history of other infectious and parasitic diseases: Secondary | ICD-10-CM | POA: Diagnosis not present

## 2015-08-13 DIAGNOSIS — F319 Bipolar disorder, unspecified: Secondary | ICD-10-CM | POA: Insufficient documentation

## 2015-08-13 DIAGNOSIS — Z792 Long term (current) use of antibiotics: Secondary | ICD-10-CM | POA: Insufficient documentation

## 2015-08-13 DIAGNOSIS — J45909 Unspecified asthma, uncomplicated: Secondary | ICD-10-CM | POA: Diagnosis not present

## 2015-08-13 DIAGNOSIS — G8929 Other chronic pain: Secondary | ICD-10-CM | POA: Diagnosis not present

## 2015-08-13 DIAGNOSIS — M79642 Pain in left hand: Secondary | ICD-10-CM | POA: Diagnosis not present

## 2015-08-13 DIAGNOSIS — M25511 Pain in right shoulder: Secondary | ICD-10-CM | POA: Diagnosis present

## 2015-08-13 DIAGNOSIS — K219 Gastro-esophageal reflux disease without esophagitis: Secondary | ICD-10-CM | POA: Insufficient documentation

## 2015-08-13 DIAGNOSIS — G43909 Migraine, unspecified, not intractable, without status migrainosus: Secondary | ICD-10-CM | POA: Insufficient documentation

## 2015-08-13 DIAGNOSIS — Z79899 Other long term (current) drug therapy: Secondary | ICD-10-CM | POA: Insufficient documentation

## 2015-08-13 LAB — URINALYSIS, ROUTINE W REFLEX MICROSCOPIC
Bilirubin Urine: NEGATIVE
Glucose, UA: NEGATIVE mg/dL
KETONES UR: NEGATIVE mg/dL
LEUKOCYTES UA: NEGATIVE
NITRITE: NEGATIVE
PH: 7 (ref 5.0–8.0)
Protein, ur: NEGATIVE mg/dL
SPECIFIC GRAVITY, URINE: 1.015 (ref 1.005–1.030)

## 2015-08-13 LAB — URINE MICROSCOPIC-ADD ON
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
## 2015-08-13 NOTE — Discharge Instructions (Signed)
Please call your orthopedist's office tomorrow for recheck and evaluation of your knee and shoulder.

## 2016-01-15 ENCOUNTER — Encounter (HOSPITAL_COMMUNITY): Payer: Self-pay | Admitting: Emergency Medicine

## 2016-01-15 ENCOUNTER — Emergency Department (HOSPITAL_COMMUNITY)
Admission: EM | Admit: 2016-01-15 | Discharge: 2016-01-15 | Disposition: A | Discharge status: Home / Self Care | Payer: Medicare Other | Attending: Emergency Medicine | Admitting: Emergency Medicine

## 2016-01-15 DIAGNOSIS — F319 Bipolar disorder, unspecified: Secondary | ICD-10-CM | POA: Insufficient documentation

## 2016-01-15 DIAGNOSIS — N390 Urinary tract infection, site not specified: Secondary | ICD-10-CM

## 2016-01-15 DIAGNOSIS — Z79891 Long term (current) use of opiate analgesic: Secondary | ICD-10-CM | POA: Insufficient documentation

## 2016-01-15 DIAGNOSIS — Z79899 Other long term (current) drug therapy: Secondary | ICD-10-CM | POA: Insufficient documentation

## 2016-01-15 DIAGNOSIS — J45909 Unspecified asthma, uncomplicated: Secondary | ICD-10-CM | POA: Insufficient documentation

## 2016-01-15 DIAGNOSIS — R1030 Lower abdominal pain, unspecified: Secondary | ICD-10-CM | POA: Diagnosis present

## 2016-01-15 LAB — CBC
HCT: 41.9 % (ref 36.0–46.0)
HEMOGLOBIN: 14.1 g/dL (ref 12.0–15.0)
MCH: 31 pg (ref 26.0–34.0)
MCHC: 33.7 g/dL (ref 30.0–36.0)
MCV: 92.1 fL (ref 78.0–100.0)
Platelets: 299 10*3/uL (ref 150–400)
RBC: 4.55 MIL/uL (ref 3.87–5.11)
RDW: 13.5 % (ref 11.5–15.5)
WBC: 11 10*3/uL — AB (ref 4.0–10.5)

## 2016-01-15 LAB — COMPREHENSIVE METABOLIC PANEL
ALBUMIN: 3.9 g/dL (ref 3.5–5.0)
ALK PHOS: 38 U/L (ref 38–126)
ALT: 23 U/L (ref 14–54)
ANION GAP: 8 (ref 5–15)
AST: 19 U/L (ref 15–41)
BILIRUBIN TOTAL: 0.4 mg/dL (ref 0.3–1.2)
BUN: 9 mg/dL (ref 6–20)
CALCIUM: 8.8 mg/dL — AB (ref 8.9–10.3)
CO2: 23 mmol/L (ref 22–32)
Chloride: 108 mmol/L (ref 101–111)
Creatinine, Ser: 0.84 mg/dL (ref 0.44–1.00)
GFR calc Af Amer: >60 mL/min (ref >60)
GLUCOSE: 99 mg/dL (ref 65–99)
POTASSIUM: 3.6 mmol/L (ref 3.5–5.1)
Sodium: 139 mmol/L (ref 135–145)
TOTAL PROTEIN: 7.4 g/dL (ref 6.5–8.1)

## 2016-01-15 LAB — URINALYSIS, ROUTINE W REFLEX MICROSCOPIC
BILIRUBIN URINE: NEGATIVE
Glucose, UA: NEGATIVE mg/dL
KETONES UR: NEGATIVE mg/dL
NITRITE: POSITIVE — AB
Protein, ur: 100 mg/dL — AB
SPECIFIC GRAVITY, URINE: 1.025 (ref 1.005–1.030)
pH: 5.5 (ref 5.0–8.0)

## 2016-01-15 LAB — URINE MICROSCOPIC-ADD ON

## 2016-01-15 LAB — LIPASE, BLOOD: Lipase: 28 U/L (ref 11–51)

## 2016-01-15 MED ORDER — ONDANSETRON HCL 4 MG/2ML IJ SOLN
4.0000 mg | Freq: Once | INTRAMUSCULAR | Status: AC
Start: 2016-01-15 — End: 2016-01-15
  Administered 2016-01-15: 4 mg via INTRAVENOUS

## 2016-01-15 MED ORDER — SODIUM CHLORIDE 0.9 % IV SOLN
INTRAVENOUS | Status: DC
Start: 2016-01-15 — End: 2016-01-15

## 2016-01-15 MED ORDER — DIPHENHYDRAMINE HCL 50 MG/ML IJ SOLN
INTRAMUSCULAR | Status: AC
  Filled 2016-01-15: qty 1

## 2016-01-15 MED ORDER — CIPROFLOXACIN IN D5W 400 MG/200ML IV SOLN
400.0000 mg | Freq: Once | INTRAVENOUS | Status: AC
  Administered 2016-01-15: 400 mg via INTRAVENOUS
  Filled 2016-01-15: qty 200

## 2016-01-15 MED ORDER — DEXTROSE 5 % IV SOLN
1.0000 g | Freq: Once | INTRAVENOUS | Status: AC
  Administered 2016-01-15: 1 g via INTRAVENOUS
  Filled 2016-01-15: qty 10

## 2016-01-15 MED ORDER — ONDANSETRON HCL 4 MG/2ML IJ SOLN
INTRAMUSCULAR | Status: AC
  Filled 2016-01-15: qty 2

## 2016-01-15 MED ORDER — SODIUM CHLORIDE 0.9 % IV BOLUS (SEPSIS)
1000.0000 mL | Freq: Once | INTRAVENOUS | Status: AC
  Administered 2016-01-15: 1000 mL via INTRAVENOUS

## 2016-01-15 MED ORDER — DIPHENHYDRAMINE HCL 50 MG/ML IJ SOLN
25.0000 mg | Freq: Once | INTRAMUSCULAR | Status: AC
  Administered 2016-01-15: 25 mg via INTRAVENOUS

## 2016-01-15 MED ORDER — PROMETHAZINE HCL 25 MG PO TABS: 25.0000 mg | ORAL_TABLET | Freq: Four times a day (QID) | ORAL | Status: DC | PRN

## 2016-01-15 MED ORDER — CEPHALEXIN 500 MG PO CAPS: 500.0000 mg | ORAL_CAPSULE | Freq: Four times a day (QID) | ORAL | Status: DC

## 2016-01-15 MED ORDER — ONDANSETRON HCL 4 MG/2ML IJ SOLN: 4.0000 mg | Freq: Once | INTRAMUSCULAR | Status: DC

## 2016-01-15 MED ORDER — HYDROCODONE-ACETAMINOPHEN 5-325 MG PO TABS
1.0000 | ORAL_TABLET | Freq: Once | ORAL | Status: AC
  Administered 2016-01-15: 1 via ORAL
  Filled 2016-01-15: qty 1

## 2016-01-15 NOTE — ED Notes (Signed)
No redness, rash to right arm .  Dr. Deretha EmoryZackowski notified.

## 2016-01-15 NOTE — ED Provider Notes (Signed)
CSN: [id number]     Arrival date & time 01/15/16  1553 History   First MD Initiated Contact with Patient 01/15/16 1607     Chief Complaint  Patient presents with  . Abdominal Pain     (Consider location/radiation/quality/duration/timing/severity/associated sxs/prior Treatment) Patient is a 45 y.o. female presenting with abdominal pain. The history is provided by the patient.  Abdominal Pain Associated symptoms: dysuria and nausea   Associated symptoms: no chest pain, no fever, no shortness of breath and no vomiting   Patient with complaint of lower abdominal pain for 2 days associated with nausea no vomiting or diarrhea. No fevers. Has dysuria and urinary frequency. Patient believes that his urinary tract infection.  Past Medical History  Diagnosis Date  . Anxiety   . GERD (gastroesophageal reflux disease)   . Migraine   . Herpes   . Back pain   . Asthma   . Knee pain, chronic   . Bipolar disorder (HCC)   . Gastric ulcer    Past Surgical History  Procedure Laterality Date  . Knee surgery    . Cervical cone biopsy    . Insertion of mesh      vaginal  . Tubal ligation    . Abdominal hysterectomy    . Cholecystectomy     History reviewed. No pertinent family history. Social History  Substance Use Topics  . Smoking status: Never Smoker   . Smokeless tobacco: None  . Alcohol Use: No     Comment: ocassional    OB History    No data available     Review of Systems  Constitutional: Negative for fever.  HENT: Negative for congestion.   Eyes: Negative for visual disturbance.  Respiratory: Negative for shortness of breath.   Cardiovascular: Negative for chest pain.  Gastrointestinal: Positive for nausea and abdominal pain. Negative for vomiting.  Genitourinary: Positive for dysuria and frequency.  Musculoskeletal: Negative for back pain.  Skin: Negative for rash.  Neurological: Negative for headaches.  Hematological: Does not bruise/bleed easily.   Psychiatric/Behavioral: Negative for confusion.      Allergies  Amoxicillin; Azithromycin; Other; Topamax; Ciprofloxacin; Penicillins; Sulfa antibiotics; and Tape  Home Medications   Prior to Admission medications   Medication Sig Start Date End Date Taking? Authorizing Provider  citalopram (CELEXA) 40 MG tablet Take 40 mg by mouth daily. 01/05/16  Yes Historical Provider, MD  clonazePAM (KLONOPIN) 0.5 MG tablet Take 0.5 mg by mouth daily.  01/03/15  Yes Historical Provider, MD  HYDROcodone-acetaminophen (NORCO/VICODIN) 5-325 MG per tablet Take 1 tablet by mouth every 8 (eight) hours as needed. for pain 01/11/15  Yes Historical Provider, MD  ketoconazole (NIZORAL) 2 % shampoo Apply 1 application topically 2 (two) times a week. 05/02/15  Yes Burgess AmorJulie Idol, PA-C  Levonorgestrel-Ethinyl Estradiol (AMETHIA,CAMRESE) 0.1-0.02 & 0.01 MG tablet Take 1 tablet by mouth every morning. 01/03/15  Yes Historical Provider, MD  omeprazole (PRILOSEC) 40 MG capsule Take 40 mg by mouth daily. 01/05/16  Yes Historical Provider, MD  rizatriptan (MAXALT) 10 MG tablet Take 1 tablet (10 mg total) by mouth as needed for migraine. May repeat in 2 hours if needed 05/02/15  Yes Burgess AmorJulie Idol, PA-C  valACYclovir (VALTREX) 1000 MG tablet Take 1 g by mouth daily. 01/03/15  Yes Historical Provider, MD  cephALEXin (KEFLEX) 500 MG capsule Take 1 capsule (500 mg total) by mouth 4 (four) times daily. 01/15/16   Vanetta MuldersScott Glorene Leitzke, MD  promethazine (PHENERGAN) 25 MG tablet Take 1 tablet (25 mg total)  by mouth every 6 (six) hours as needed. 01/15/16   Vanetta Mulders, MD   BP 108/65 mmHg  Pulse 82  Temp(Src) 98 F (36.7 C) (Oral)  Ht  (1.626 m)  Wt 58.968 kg  BMI 22.30 kg/m2  SpO2 97% Physical Exam  Constitutional: She is oriented to person, place, and time. She appears well-developed and well-nourished. No distress.  HENT:  Head: Normocephalic and atraumatic.  Mouth/Throat: Oropharynx is clear and moist.  Eyes: Conjunctivae are  normal. Pupils are equal, round, and reactive to light.  Neck: Normal range of motion. Neck supple.  Cardiovascular: Normal rate, regular rhythm and normal heart sounds.   No murmur heard. Pulmonary/Chest: Effort normal and breath sounds normal. No respiratory distress.  Abdominal: Soft. Bowel sounds are normal. There is no tenderness.  Musculoskeletal: Normal range of motion.  Neurological: She is alert and oriented to person, place, and time. No cranial nerve deficit. She exhibits normal muscle tone. Coordination normal.  Skin: Skin is warm. No rash noted.  Nursing note and vitals reviewed.   ED Course  Procedures (including critical care time) Labs Review Labs Reviewed  COMPREHENSIVE METABOLIC PANEL - Abnormal; Notable for the following:    Calcium 8.8 (*)    All other components within normal limits  CBC - Abnormal; Notable for the following:    WBC 11.0 (*)    All other components within normal limits  URINALYSIS, ROUTINE W REFLEX MICROSCOPIC (NOT AT [facility]) - Abnormal; Notable for the following:    APPearance HAZY (*)    Hgb urine dipstick LARGE (*)    Protein, ur 100 (*)    Nitrite POSITIVE (*)    Leukocytes, UA SMALL (*)    All other components within normal limits  URINE MICROSCOPIC-ADD ON - Abnormal; Notable for the following:    Squamous Epithelial / LPF 6-30 (*)    Bacteria, UA MANY (*)    All other components within normal limits  URINE CULTURE  LIPASE, BLOOD    Imaging Review No results found. I have personally reviewed and evaluated these images and lab results as part of my medical decision-making.   EKG Interpretation None      MDM   Final diagnoses:  UTI (lower urinary tract infection)    Urinalysis consistent with urinary tract infection which also fits her clinical symptoms. Urine culture sent. Patient recently treated with Cipro because of her allergy to penicillins. Patient seemed to have a allergic reaction to the Cipro with redness and  streaking and itching to the arm where the IV was going in. It was stopped patient received 25 mg of Benadryl things settle down. Patient stated that she has had cephalosporins in the past without problems. So she was given 1 g of Rocephin she tolerated this fine. Patient will be treated at home for the urinary tract infection with Keflex. Patient has hydrocodone at home for pain. Patient also given Phenergan for any nausea or vomiting that may develop. Patient nontoxic no acute distress.    Vanetta Mulders, MD 01/15/16 1905

## 2016-01-15 NOTE — Discharge Instructions (Signed)
Take antibiotic Keflex as directed. Expect improvement in the urinary tract symptoms in 2 days. Return for any new or worse symptoms or if not improving. Take the Phenergan as needed for nausea and vomiting. Take her hydrocodone that you have at home for pain as needed.

## 2016-01-15 NOTE — ED Notes (Signed)
Pt reports lower abdominal pain x2 days with nausea, denies v/d.  Pt has pressure with urination, no vaginal discharge at this time. Pt has hx of cysts on ovaries.

## 2016-01-15 NOTE — ED Notes (Signed)
Right arm itching and red.  Stopped antibiodic and notified MD

## 2016-01-15 NOTE — ED Notes (Signed)
Pt resting quietly with husband at bedside.

## 2016-01-18 LAB — URINE CULTURE

## 2016-01-19 ENCOUNTER — Telehealth (HOSPITAL_BASED_OUTPATIENT_CLINIC_OR_DEPARTMENT_OTHER): Payer: Self-pay

## 2016-01-19 NOTE — Telephone Encounter (Signed)
Post ED Visit - Positive Culture Follow-up  Culture report reviewed by antimicrobial stewardship pharmacist:  []  Enzo BiNathan Batchelder, Pharm.D. []  Celedonio MiyamotoJeremy Frens, Pharm.D., BCPS []  Garvin FilaMike Maccia, Pharm.D. []  Georgina PillionElizabeth Martin, Pharm.D., BCPS []  Pine RidgeMinh Pham, [address]Ph[address]D., BCPS, AAHIVP []  Estella HuskMichelle Turner, Pharm.D., BCPS, AAHIVP [x]  Tennis Mustassie Stewart, [address]Pharm[address] []  Sherle Poeob Vincent, [address]  Positive urine culture Treated with Cephalexin, organism sensitive to the same and no further patient follow-up is required at this time.  Jerry CarasCullom, Rylinn Linzy Burnett 01/19/2016, 11:10 AM

## 2016-03-04 ENCOUNTER — Emergency Department (HOSPITAL_COMMUNITY)
Admission: EM | Admit: 2016-03-04 | Discharge: 2016-03-04 | Disposition: A | Discharge status: Home / Self Care | Payer: Medicare Other | Attending: Emergency Medicine | Admitting: Emergency Medicine

## 2016-03-04 ENCOUNTER — Encounter (HOSPITAL_COMMUNITY): Payer: Self-pay | Admitting: *Deleted

## 2016-03-04 DIAGNOSIS — Y999 Unspecified external cause status: Secondary | ICD-10-CM | POA: Diagnosis not present

## 2016-03-04 DIAGNOSIS — Y9389 Activity, other specified: Secondary | ICD-10-CM | POA: Diagnosis not present

## 2016-03-04 DIAGNOSIS — J45909 Unspecified asthma, uncomplicated: Secondary | ICD-10-CM | POA: Diagnosis not present

## 2016-03-04 DIAGNOSIS — M5136 Other intervertebral disc degeneration, lumbar region: Secondary | ICD-10-CM | POA: Diagnosis not present

## 2016-03-04 DIAGNOSIS — X501XXA Overexertion from prolonged static or awkward postures, initial encounter: Secondary | ICD-10-CM | POA: Diagnosis not present

## 2016-03-04 DIAGNOSIS — S39012A Strain of muscle, fascia and tendon of lower back, initial encounter: Secondary | ICD-10-CM | POA: Diagnosis not present

## 2016-03-04 DIAGNOSIS — Y929 Unspecified place or not applicable: Secondary | ICD-10-CM | POA: Insufficient documentation

## 2016-03-04 DIAGNOSIS — M545 Low back pain: Secondary | ICD-10-CM | POA: Diagnosis present

## 2016-03-04 LAB — URINE MICROSCOPIC-ADD ON

## 2016-03-04 LAB — URINALYSIS, ROUTINE W REFLEX MICROSCOPIC
BILIRUBIN URINE: NEGATIVE
Glucose, UA: NEGATIVE mg/dL
Ketones, ur: NEGATIVE mg/dL
Leukocytes, UA: NEGATIVE
NITRITE: NEGATIVE
PH: 5.5 (ref 5.0–8.0)
Protein, ur: NEGATIVE mg/dL

## 2016-03-04 MED ORDER — MORPHINE SULFATE (PF) 4 MG/ML IV SOLN
4.0000 mg | Freq: Once | INTRAVENOUS | Status: AC
  Administered 2016-03-04: 4 mg via INTRAMUSCULAR
  Filled 2016-03-04: qty 1

## 2016-03-04 MED ORDER — CYCLOBENZAPRINE HCL 10 MG PO TABS
10.0000 mg | ORAL_TABLET | Freq: Once | ORAL | Status: AC
  Administered 2016-03-04: 10 mg via ORAL
  Filled 2016-03-04: qty 1

## 2016-03-04 MED ORDER — ONDANSETRON HCL 4 MG PO TABS
4.0000 mg | ORAL_TABLET | Freq: Once | ORAL | Status: AC
  Administered 2016-03-04: 4 mg via ORAL
  Filled 2016-03-04: qty 1

## 2016-03-04 MED ORDER — CYCLOBENZAPRINE HCL 10 MG PO TABS: 10.0000 mg | ORAL_TABLET | Freq: Three times a day (TID) | ORAL | 0 refills | Status: DC

## 2016-03-04 NOTE — ED Provider Notes (Signed)
[facility] Provider Note   CSN: [id number] Arrival date & time: 03/04/16  2009  First Provider Contact:  First MD Initiated Contact with Patient 03/04/16 2036        History   Chief Complaint Chief Complaint  Patient presents with  . Back Pain    HPI Nada Mekeel is a 45 y.o. female.  Patient is a 45 year old female who presents to the emergency department with a complaint of left lower back pain.  The patient states that approximately 2 or 3 days ago she was attempting to move a bed. It is of note that the patient has degenerative disc disease problems with her back. She states that she has tried heating pad, rest, and her medications from her pain management center, but she continues to have more discomfort. She presents to the emergency department for assistance with this problem. She denies difficulty with controlling her urination. His been no frequent falls. Lying down seems to help her pain a little, but nothing seems to take it away completely.      Past Medical History:  Diagnosis Date  . Anxiety   . Asthma   . Back pain   . Bipolar disorder (HCC)   . Gastric ulcer   . GERD (gastroesophageal reflux disease)   . Herpes   . Knee pain, chronic   . Migraine     There are no active problems to display for this patient.   Past Surgical History:  Procedure Laterality Date  . ABDOMINAL HYSTERECTOMY    . CERVICAL CONE BIOPSY    . CHOLECYSTECTOMY    . INSERTION OF MESH     vaginal  . KNEE SURGERY    . TUBAL LIGATION      OB History    No data available       Home Medications    Prior to Admission medications   Medication Sig Start Date End Date Taking? Authorizing Provider  cephALEXin (KEFLEX) 500 MG capsule Take 1 capsule (500 mg total) by mouth 4 (four) times daily. 01/15/16   Vanetta Mulders, MD  citalopram (CELEXA) 40 MG tablet Take 40 mg by mouth daily. 01/05/16   Historical Provider, MD  clonazePAM (KLONOPIN) 0.5 MG tablet Take 0.5 mg by  mouth daily.  01/03/15   Historical Provider, MD  cyclobenzaprine (FLEXERIL) 10 MG tablet Take 1 tablet (10 mg total) by mouth 3 (three) times daily. 03/04/16   Ivery Quale, PA-C  HYDROcodone-acetaminophen (NORCO/VICODIN) 5-325 MG per tablet Take 1 tablet by mouth every 8 (eight) hours as needed. for pain 01/11/15   Historical Provider, MD  ketoconazole (NIZORAL) 2 % shampoo Apply 1 application topically 2 (two) times a week. 05/02/15   Burgess Amor, PA-C  Levonorgestrel-Ethinyl Estradiol (AMETHIA,CAMRESE) 0.1-0.02 & 0.01 MG tablet Take 1 tablet by mouth every morning. 01/03/15   Historical Provider, MD  omeprazole (PRILOSEC) 40 MG capsule Take 40 mg by mouth daily. 01/05/16   Historical Provider, MD  promethazine (PHENERGAN) 25 MG tablet Take 1 tablet (25 mg total) by mouth every 6 (six) hours as needed. 01/15/16   Vanetta Mulders, MD  rizatriptan (MAXALT) 10 MG tablet Take 1 tablet (10 mg total) by mouth as needed for migraine. May repeat in 2 hours if needed 05/02/15   Burgess Amor, PA-C  valACYclovir (VALTREX) 1000 MG tablet Take 1 g by mouth daily. 01/03/15   Historical Provider, MD    Family History History reviewed. No pertinent family history.  Social History Social History  Substance Use  Topics  . Smoking status: Never Smoker  . Smokeless tobacco: Never Used  . Alcohol use No     Comment: ocassional      Allergies   Amoxicillin; Azithromycin; Other; Topamax [topiramate]; Ciprofloxacin; Penicillins; Sulfa antibiotics; and Tape   Review of Systems Review of Systems  Musculoskeletal: Positive for arthralgias and back pain.  Psychiatric/Behavioral: The patient is nervous/anxious.   All other systems reviewed and are negative.    Physical Exam Updated Vital Signs BP 123/72 (BP Location: Left Arm)   Pulse 93   Temp 97.8 F (36.6 C) (Oral)   Resp 18   Ht  (1.626 m)   Wt 67.1 kg   SpO2 98%   BMI 25.40 kg/m   Physical Exam  Constitutional: She is oriented to person, place,  and time. She appears well-developed and well-nourished.  Non-toxic appearance.  HENT:  Head: Normocephalic.  Right Ear: Tympanic membrane and external ear normal.  Left Ear: Tympanic membrane and external ear normal.  Eyes: EOM and lids are normal. Pupils are equal, round, and reactive to light.  Neck: Normal range of motion. Neck supple. Carotid bruit is not present.  Cardiovascular: Normal rate, regular rhythm, normal heart sounds, intact distal pulses and normal pulses.   Pulmonary/Chest: Breath sounds normal. No respiratory distress.  Abdominal: Soft. Bowel sounds are normal. There is no tenderness. There is no guarding.  Musculoskeletal:       Lumbar back: She exhibits decreased range of motion, pain and spasm.  Lymphadenopathy:       Head (right side): No submandibular adenopathy present.       Head (left side): No submandibular adenopathy present.    She has no cervical adenopathy.  Neurological: She is alert and oriented to person, place, and time. She has normal strength. No cranial nerve deficit or sensory deficit.  There is good range of motion of the neck, but with some soreness. Grip is symmetrical. There is no motor or sensory deficits of the upper extremity on.  There no motor or sensory deficits of the lower extremity. There is no foot drop. The gait is steady.  Skin: Skin is warm and dry.  Psychiatric: She has a normal mood and affect. Her speech is normal.  Nursing note and vitals reviewed.    ED Treatments / Results  Labs (all labs ordered are listed, but only abnormal results are displayed) Labs Reviewed  URINALYSIS, ROUTINE W REFLEX MICROSCOPIC (NOT AT [facility]) - Abnormal; Notable for the following:       Result Value   Specific Gravity, Urine >1.030 (*)    Hgb urine dipstick SMALL (*)    All other components within normal limits  URINE MICROSCOPIC-ADD ON - Abnormal; Notable for the following:    Squamous Epithelial / LPF 0-5 (*)    Bacteria, UA FEW (*)    All  other components within normal limits    EKG  EKG Interpretation None       Radiology No results found.  Procedures Procedures (including critical care time)  Medications Ordered in ED Medications  cyclobenzaprine (FLEXERIL) tablet 10 mg (not administered)  morphine 4 MG/ML injection 4 mg (not administered)  ondansetron (ZOFRAN) tablet 4 mg (not administered)     Initial Impression / Assessment and Plan / ED Course  I have reviewed the triage vital signs and the nursing notes.  Pertinent labs & imaging results that were available during my care of the patient were reviewed by me and considered in my  medical decision making (see chart for details).  Clinical Course    **I have reviewed nursing notes, vital signs, and all appropriate lab and imaging results for this patient.*  Final Clinical Impressions(s) / ED Diagnoses  The patient states that she has degenerative disc disease problems. She was attempting to move a bed onto a 3 days ago and has been having pain since that time. The examination favors lumbar strain, and aggravation of her previous degenerative disc disease problem. The urinalysis was abnormal. A culture will be sent to the lab for further evaluation of possible early infection.  The patient has medication for pain to her pain management physician. A prescription for Flexeril 3 times a day will be given to the patient to use. The patient is in agreement with this discharge plan.    Final diagnoses:  Lumbar strain, initial encounter  DDD (degenerative disc disease), lumbar    New Prescriptions New Prescriptions   CYCLOBENZAPRINE (FLEXERIL) 10 MG TABLET    Take 1 tablet (10 mg total) by mouth 3 (three) times daily.     Ivery Quale, PA-C 03/04/16 2131    Rolland Porter, MD 03/18/16 [phone]

## 2016-03-04 NOTE — Discharge Instructions (Signed)
Your urine test was inconclusive. A culture has been sent to the lab on. Please follow-up with your primary physician for recheck of this culture. Your examination favors lumbar strain, and aggravation of your previous degenerative disc disease. Please use your current medications on. Please add Flexeril 3 times daily for spasm pain. Flexeril may cause drowsiness, please use this medication with caution.

## 2016-03-04 NOTE — ED Triage Notes (Signed)
Pt c/o left flank pain that started x 2 days ago; pt states she noticed some burning with urination and states her urine is darker in color; pt c/o some nausea

## 2016-04-15 ENCOUNTER — Emergency Department (HOSPITAL_COMMUNITY)
Admission: EM | Admit: 2016-04-15 | Discharge: 2016-04-15 | Disposition: A | Discharge status: Home / Self Care | Payer: Medicare Other | Attending: Emergency Medicine | Admitting: Emergency Medicine

## 2016-04-15 ENCOUNTER — Encounter (HOSPITAL_COMMUNITY): Payer: Self-pay | Admitting: Emergency Medicine

## 2016-04-15 DIAGNOSIS — Z5321 Procedure and treatment not carried out due to patient leaving prior to being seen by health care provider: Secondary | ICD-10-CM | POA: Insufficient documentation

## 2016-04-15 DIAGNOSIS — J45909 Unspecified asthma, uncomplicated: Secondary | ICD-10-CM | POA: Diagnosis not present

## 2016-04-15 DIAGNOSIS — H5711 Ocular pain, right eye: Secondary | ICD-10-CM | POA: Diagnosis present

## 2016-04-15 DIAGNOSIS — Z79899 Other long term (current) drug therapy: Secondary | ICD-10-CM | POA: Insufficient documentation

## 2016-04-15 NOTE — ED Notes (Signed)
Pt called out asking when provider would see her. Informed pt that our EDP's were working as fast as they could with a full department. Pt verbalized understanding.Comfort measures performed.

## 2016-04-15 NOTE — ED Triage Notes (Signed)
Pt states she feels like she has something in her eye. States when she woke up, her eye was normal, until she washed her face. RT eye conjunctiva is red. Watery, clear drainage. No visible foreign body noted.

## 2016-04-15 NOTE — ED Notes (Signed)
Pt noted to not be in her room. Staff did not see pt leave.

## 2016-06-12 ENCOUNTER — Encounter (HOSPITAL_COMMUNITY): Payer: Self-pay | Admitting: Emergency Medicine

## 2016-06-12 ENCOUNTER — Emergency Department (HOSPITAL_COMMUNITY)
Admission: EM | Admit: 2016-06-12 | Discharge: 2016-06-12 | Disposition: A | Discharge status: Home / Self Care | Payer: Medicare Other | Attending: Emergency Medicine | Admitting: Emergency Medicine

## 2016-06-12 DIAGNOSIS — H9201 Otalgia, right ear: Secondary | ICD-10-CM | POA: Diagnosis present

## 2016-06-12 DIAGNOSIS — L089 Local infection of the skin and subcutaneous tissue, unspecified: Secondary | ICD-10-CM

## 2016-06-12 DIAGNOSIS — Z79899 Other long term (current) drug therapy: Secondary | ICD-10-CM | POA: Insufficient documentation

## 2016-06-12 DIAGNOSIS — J45909 Unspecified asthma, uncomplicated: Secondary | ICD-10-CM | POA: Diagnosis not present

## 2016-06-12 DIAGNOSIS — Z7982 Long term (current) use of aspirin: Secondary | ICD-10-CM | POA: Insufficient documentation

## 2016-06-12 LAB — CBC WITH DIFFERENTIAL/PLATELET
Basophils Absolute: 0 10*3/uL (ref 0.0–0.1)
Basophils Relative: 0 %
Eosinophils Absolute: 0 10*3/uL (ref 0.0–0.7)
Eosinophils Relative: 1 %
HEMATOCRIT: 42.9 % (ref 36.0–46.0)
HEMOGLOBIN: 14.6 g/dL (ref 12.0–15.0)
LYMPHS ABS: 1.9 10*3/uL (ref 0.7–4.0)
LYMPHS PCT: 28 %
MCH: 31.3 pg (ref 26.0–34.0)
MCHC: 34 g/dL (ref 30.0–36.0)
MCV: 92.1 fL (ref 78.0–100.0)
MONOS PCT: 6 %
Monocytes Absolute: 0.4 10*3/uL (ref 0.1–1.0)
NEUTROS ABS: 4.3 10*3/uL (ref 1.7–7.7)
NEUTROS PCT: 65 %
Platelets: 277 10*3/uL (ref 150–400)
RBC: 4.66 MIL/uL (ref 3.87–5.11)
RDW: 13.4 % (ref 11.5–15.5)
WBC: 6.7 10*3/uL (ref 4.0–10.5)

## 2016-06-12 LAB — BASIC METABOLIC PANEL
Anion gap: 6 (ref 5–15)
BUN: 6 mg/dL (ref 6–20)
CHLORIDE: 102 mmol/L (ref 101–111)
CO2: 27 mmol/L (ref 22–32)
Calcium: 8.8 mg/dL — ABNORMAL LOW (ref 8.9–10.3)
Creatinine, Ser: 0.71 mg/dL (ref 0.44–1.00)
GFR calc Af Amer: >60 mL/min (ref >60)
GFR calc non Af Amer: >60 mL/min (ref >60)
GLUCOSE: 89 mg/dL (ref 65–99)
POTASSIUM: 3.7 mmol/L (ref 3.5–5.1)
SODIUM: 135 mmol/L (ref 135–145)

## 2016-06-12 MED ORDER — NAPROXEN 500 MG PO TABS: ORAL_TABLET | ORAL | 0 refills | Status: DC

## 2016-06-12 MED ORDER — DOXYCYCLINE HYCLATE 100 MG PO CAPS: 100.0000 mg | ORAL_CAPSULE | Freq: Two times a day (BID) | ORAL | 0 refills | Status: DC

## 2016-06-12 NOTE — ED Triage Notes (Signed)
Right ear pain, cough at night

## 2016-06-12 NOTE — ED Provider Notes (Signed)
[facility] Provider Note   CSN: [id number] Arrival date & time: 06/12/16  1511     History   Chief Complaint Chief Complaint  Patient presents with  . Otalgia    HPI Melanie Proctor is a 45 y.o. female.  Patient states that she has pain behind her right ear. She also has a rash to right side of her face. She has gotten this rash before and taken medicine for   The history is provided by the patient. No language interpreter was used.  Otalgia  This is a new problem. The current episode started more than 2 days ago. There is pain in the right ear. The problem occurs constantly. The problem has not changed since onset.There has been no fever. Associated symptoms include rash. Pertinent negatives include no ear discharge, no headaches, no abdominal pain, no diarrhea and no cough.    Past Medical History:  Diagnosis Date  . Anxiety   . Asthma   . Back pain   . Bipolar disorder (HCC)   . Gastric ulcer   . GERD (gastroesophageal reflux disease)   . Herpes   . Knee pain, chronic   . Migraine     There are no active problems to display for this patient.   Past Surgical History:  Procedure Laterality Date  . ABDOMINAL HYSTERECTOMY    . CERVICAL CONE BIOPSY    . CHOLECYSTECTOMY    . INSERTION OF MESH     vaginal  . KNEE SURGERY    . TUBAL LIGATION      OB History    No data available       Home Medications    Prior to Admission medications   Medication Sig Start Date End Date Taking? Authorizing Provider  Aspirin-Acetaminophen-Caffeine (GOODY HEADACHE PO) Take 1 packet by mouth every 6 (six) hours as needed (headaches).   Yes Historical Provider, MD  citalopram (CELEXA) 40 MG tablet Take 40 mg by mouth daily. 01/05/16  Yes Historical Provider, MD  DiphenhydrAMINE HCl, Sleep, (NIGHTTIME SLEEP PO) Take 10 mLs by mouth at bedtime. To help for sleep   Yes Historical Provider, MD  ketoconazole (NIZORAL) 2 % shampoo Apply 1 application topically 2 (two) times a  week. 05/02/15  Yes Raynelle FanningJulie Idol, PA-C  NUCYNTA 75 MG tablet Take 1 tablet by mouth 2 (two) times daily. 05/29/16  Yes Historical Provider, MD  omeprazole (PRILOSEC) 40 MG capsule Take 40 mg by mouth daily. 01/05/16  Yes Historical Provider, MD  rizatriptan (MAXALT) 10 MG tablet Take 1 tablet (10 mg total) by mouth as needed for migraine. May repeat in 2 hours if needed 05/02/15  Yes Burgess AmorJulie Idol, PA-C  valACYclovir (VALTREX) 1000 MG tablet Take 1 g by mouth daily. 01/03/15  Yes Historical Provider, MD  albuterol (PROVENTIL HFA;VENTOLIN HFA) 108 (90 Base) MCG/ACT inhaler Inhale 2 puffs into the lungs every 4 (four) hours as needed.    Historical Provider, MD  doxycycline (VIBRAMYCIN) 100 MG capsule Take 1 capsule (100 mg total) by mouth 2 (two) times daily. One po bid x 7 days 06/12/16   Bethann BerkshireJoseph Aubrianna Orchard, MD  naproxen (NAPROSYN) 500 MG tablet Take one pill every 12 hours for pain behind ear 06/12/16   Bethann BerkshireJoseph Pleas Carneal, MD    Family History No family history on file.  Social History Social History  Substance Use Topics  . Smoking status: Never Smoker  . Smokeless tobacco: Never Used  . Alcohol use No     Comment: ocassional  Allergies   Amoxicillin; Azithromycin; Other; Topamax [topiramate]; Ciprofloxacin; Penicillins; Sulfa antibiotics; and Tape   Review of Systems Review of Systems  Constitutional: Negative for appetite change and fatigue.  HENT: Positive for ear pain. Negative for congestion, ear discharge and sinus pressure.   Eyes: Negative for discharge.  Respiratory: Negative for cough.   Cardiovascular: Negative for chest pain.  Gastrointestinal: Negative for abdominal pain and diarrhea.  Genitourinary: Negative for frequency and hematuria.  Musculoskeletal: Negative for back pain.  Skin: Positive for rash.  Neurological: Negative for seizures and headaches.  Psychiatric/Behavioral: Negative for hallucinations.     Physical Exam Updated Vital Signs BP (!) 135/52 (BP Location:  Left Arm)   Pulse 98   Temp 98.3 F (36.8 C) (Oral)   Resp 16   Ht 5\' 4"  (1.626 m)   Wt 165 lb (74.8 kg)   SpO2 100%   BMI 28.32 kg/m   Physical Exam  Constitutional: She is oriented to person, place, and time. She appears well-developed.  HENT:  Head: Normocephalic.  Patient has tenderness behind her right ear. Rest of ear exam normal  Eyes: Conjunctivae and EOM are normal. No scleral icterus.  Neck: Neck supple. No thyromegaly present.  Cardiovascular: Normal rate and regular rhythm.  Exam reveals no gallop and no friction rub.   No murmur heard. Pulmonary/Chest: No stridor. She has no wheezes. She has no rales. She exhibits no tenderness.  Abdominal: She exhibits no distension. There is no tenderness. There is no rebound.  Musculoskeletal: Normal range of motion. She exhibits no edema.  Lymphadenopathy:    She has no cervical adenopathy.  Neurological: She is oriented to person, place, and time. She exhibits normal muscle tone ( ). Coordination normal.  Skin: Rash noted. There is erythema.  Patient has a rash to the right side of face looks like skin infection. She has had this before. Doubt this is shingles  Psychiatric: She has a normal mood and affect. Her behavior is normal.     ED Treatments / Results  Labs (all labs ordered are listed, but only abnormal results are displayed) Labs Reviewed  BASIC METABOLIC PANEL - Abnormal; Notable for the following:       Result Value   Calcium 8.8 (*)    All other components within normal limits  CBC WITH DIFFERENTIAL/PLATELET    EKG  EKG Interpretation None       Radiology No results found.  Procedures Procedures (including critical care time)  Medications Ordered in ED Medications - No data to display   Initial Impression / Assessment and Plan / ED Course  I have reviewed the triage vital signs and the nursing notes.  Pertinent labs & imaging results that were available during my care of the patient were  reviewed by me and considered in my medical decision making (see chart for details).  Clinical Course    Skin infection and pain behind ear. Patient is treated with doxycycline and Naprosyn will follow-up with her doctor next week  Final Clinical Impressions(s) / ED Diagnoses   Final diagnoses:  Skin infection    New Prescriptions New Prescriptions   DOXYCYCLINE (VIBRAMYCIN) 100 MG CAPSULE    Take 1 capsule (100 mg total) by mouth 2 (two) times daily. One po bid x 7 days   NAPROXEN (NAPROSYN) 500 MG TABLET    Take one pill every 12 hours for pain behind ear     Bethann BerkshireJoseph Chau Sawin, MD 06/12/16 1719

## 2016-06-12 NOTE — Discharge Instructions (Signed)
Follow-up with her doctor Wednesday as planned

## 2016-07-08 ENCOUNTER — Other Ambulatory Visit: Payer: Self-pay | Admitting: Nurse Practitioner

## 2016-07-08 DIAGNOSIS — N6320 Unspecified lump in the left breast, unspecified quadrant: Secondary | ICD-10-CM

## 2016-07-08 DIAGNOSIS — N644 Mastodynia: Secondary | ICD-10-CM

## 2016-07-08 DIAGNOSIS — N6325 Unspecified lump in the left breast, overlapping quadrants: Secondary | ICD-10-CM

## 2016-07-15 ENCOUNTER — Other Ambulatory Visit: Payer: Medicare Other

## 2016-07-23 ENCOUNTER — Ambulatory Visit
Admission: RE | Admit: 2016-07-23 | Discharge: 2016-07-23 | Disposition: A | Discharge status: Home / Self Care | Payer: Medicare Other | Source: Ambulatory Visit | Attending: Nurse Practitioner | Admitting: Nurse Practitioner

## 2016-07-23 DIAGNOSIS — N644 Mastodynia: Secondary | ICD-10-CM

## 2016-07-23 DIAGNOSIS — N6320 Unspecified lump in the left breast, unspecified quadrant: Secondary | ICD-10-CM

## 2016-07-23 DIAGNOSIS — N6325 Unspecified lump in the left breast, overlapping quadrants: Secondary | ICD-10-CM

## 2017-01-30 ENCOUNTER — Emergency Department (HOSPITAL_COMMUNITY)
Admission: EM | Admit: 2017-01-30 | Discharge: 2017-01-30 | Disposition: A | Discharge status: Home / Self Care | Payer: Medicare Other | Attending: Emergency Medicine | Admitting: Emergency Medicine

## 2017-01-30 ENCOUNTER — Emergency Department (HOSPITAL_COMMUNITY): Payer: Medicare Other

## 2017-01-30 ENCOUNTER — Encounter (HOSPITAL_COMMUNITY): Payer: Self-pay | Admitting: *Deleted

## 2017-01-30 DIAGNOSIS — R1030 Lower abdominal pain, unspecified: Secondary | ICD-10-CM | POA: Diagnosis present

## 2017-01-30 DIAGNOSIS — Z79899 Other long term (current) drug therapy: Secondary | ICD-10-CM | POA: Diagnosis not present

## 2017-01-30 DIAGNOSIS — N3090 Cystitis, unspecified without hematuria: Secondary | ICD-10-CM

## 2017-01-30 DIAGNOSIS — N3091 Cystitis, unspecified with hematuria: Secondary | ICD-10-CM | POA: Insufficient documentation

## 2017-01-30 DIAGNOSIS — J45909 Unspecified asthma, uncomplicated: Secondary | ICD-10-CM | POA: Insufficient documentation

## 2017-01-30 HISTORY — DX: Personal history of urinary (tract) infections: Z87.440

## 2017-01-30 LAB — URINALYSIS, ROUTINE W REFLEX MICROSCOPIC
BILIRUBIN URINE: NEGATIVE
Glucose, UA: NEGATIVE mg/dL
Ketones, ur: NEGATIVE mg/dL
NITRITE: NEGATIVE
PH: 5 (ref 5.0–8.0)
Protein, ur: NEGATIVE mg/dL
Specific Gravity, Urine: 1.02 (ref 1.005–1.030)

## 2017-01-30 MED ORDER — NITROFURANTOIN MONOHYD MACRO 100 MG PO CAPS: 100.0000 mg | ORAL_CAPSULE | Freq: Two times a day (BID) | ORAL | 0 refills | Status: AC

## 2017-01-30 NOTE — ED Triage Notes (Signed)
Pt reports hx of uti's. Pt reports last week having lower abdominal pain, nausea, dysuria, hematuria, urinary urgency and frequency. Pt reports symptoms came back today.

## 2017-01-30 NOTE — Discharge Instructions (Signed)
Take the prescription as directed.  Call your regular medical doctor on Monday to schedule a follow up appointment within the next 3 days.  Return to the Emergency Department immediately sooner if worsening.  ° °

## 2017-01-30 NOTE — ED Provider Notes (Signed)
[facility] DEPT Provider Note   CSN: [id number] Arrival date & time: 01/30/17  2008     History   Chief Complaint Chief Complaint  Patient presents with  . Abdominal Pain    HPI Melanie Proctor is a 46 y.o. female.  HPI  Pt was seen at 2125. Per pt, c/o gradual onset and persistence of constant dysuria, urinary frequency, hematuria, and urgency for the past 1 week. Denies flank pain, no fevers, no vomiting/diarrhea, no vaginal bleeding/discharge, no rash.   Past Medical History:  Diagnosis Date  . Anxiety   . Asthma   . Back pain   . Bipolar disorder (HCC)   . Gastric ulcer   . GERD (gastroesophageal reflux disease)   . Herpes   . History of frequent urinary tract infections   . Knee pain, chronic   . Migraine     There are no active problems to display for this patient.   Past Surgical History:  Procedure Laterality Date  . ABDOMINAL HYSTERECTOMY    . CERVICAL CONE BIOPSY    . CHOLECYSTECTOMY    . INSERTION OF MESH     vaginal  . KNEE SURGERY    . TUBAL LIGATION      OB History    No data available       Home Medications    Prior to Admission medications   Medication Sig Start Date End Date Taking? Authorizing Provider  albuterol (PROVENTIL HFA;VENTOLIN HFA) 108 (90 Base) MCG/ACT inhaler Inhale 2 puffs into the lungs every 4 (four) hours as needed.   Yes [provider]  Aspirin-Acetaminophen-Caffeine (GOODY HEADACHE PO) Take 1 packet by mouth every 6 (six) hours as needed (headaches).   Yes [provider]  citalopram (CELEXA) 40 MG tablet Take 40 mg by mouth daily. 01/05/16  Yes [provider]  ketoconazole (NIZORAL) 2 % shampoo Apply 1 application topically 2 (two) times a week. 05/02/15  Yes Idol, Raynelle Fanning, PA-C  NUCYNTA 75 MG tablet Take 1 tablet by mouth 2 (two) times daily. 05/29/16  Yes [provider]  omeprazole (PRILOSEC) 40 MG capsule Take 40 mg by mouth daily. 01/05/16  Yes [provider]    rizatriptan (MAXALT) 10 MG tablet Take 1 tablet (10 mg total) by mouth as needed for migraine. May repeat in 2 hours if needed 05/02/15  Yes Idol, Raynelle Fanning, PA-C  valACYclovir (VALTREX) 1000 MG tablet Take 1 g by mouth daily. 01/03/15  Yes [provider]    Family History History reviewed. No pertinent family history.  Social History Social History  Substance Use Topics  . Smoking status: Never Smoker  . Smokeless tobacco: Never Used  . Alcohol use No     Comment: ocassional      Allergies   Amoxicillin; Azithromycin; Other; Topamax [topiramate]; Ciprofloxacin; Penicillins; Sulfa antibiotics; and Tape   Review of Systems Review of Systems ROS: Statement: All systems negative except as marked or noted in the HPI; Constitutional: Negative for fever and chills. ; ; Eyes: Negative for eye pain, redness and discharge. ; ; ENMT: Negative for ear pain, hoarseness, nasal congestion, sinus pressure and sore throat. ; ; Cardiovascular: Negative for chest pain, palpitations, diaphoresis, dyspnea and peripheral edema. ; ; Respiratory: Negative for cough, wheezing and stridor. ; ; Gastrointestinal: +nausea. Negative for vomiting, diarrhea, abdominal pain, blood in stool, hematemesis, jaundice and rectal bleeding. . ; ; Genitourinary: Negative for flank pain. +dysuria and hematuria. ; ; Musculoskeletal: Negative for back pain and neck pain.  Negative for swelling and trauma.; ; Skin: Negative for pruritus, rash, abrasions, blisters, bruising and skin lesion.; ; Neuro: Negative for headache, lightheadedness and neck stiffness. Negative for weakness, altered level of consciousness, altered mental status, extremity weakness, paresthesias, involuntary movement, seizure and syncope.       Physical Exam Updated Vital Signs BP 124/63 (BP Location: Right Arm)   Pulse 93   Temp 98.1 F (36.7 C) (Oral)   Resp 20   Ht 5\' 4"  (1.626 m)   Wt 72.6 kg (160 lb)   SpO2 97%   BMI 27.46 kg/m   Physical  Exam 2125: Physical examination:  Nursing notes reviewed; Vital signs and O2 SAT reviewed;  Constitutional: Well developed, Well nourished, Well hydrated, In no acute distress; Head:  Normocephalic, atraumatic; Eyes: EOMI, PERRL, No scleral icterus; ENMT: Mouth and pharynx normal, Mucous membranes moist; Neck: Supple, Full range of motion, No lymphadenopathy; Cardiovascular: Regular rate and rhythm, No gallop; Respiratory: Breath sounds clear & equal bilaterally, No wheezes.  Speaking full sentences with ease, Normal respiratory effort/excursion; Chest: Nontender, Movement normal; Abdomen: Soft, +suprapubic area tenderness to palp. No rebound or guarding. Nondistended, Normal bowel sounds; Genitourinary: No CVA tenderness; Extremities: Pulses normal, No tenderness, No edema, No calf edema or asymmetry.; Neuro: AA&Ox3, Major CN grossly intact.  Speech clear. No gross focal motor or sensory deficits in extremities.; Skin: Color normal, Warm, Dry.   ED Treatments / Results  Labs (all labs ordered are listed, but only abnormal results are displayed)   EKG  EKG Interpretation None       Radiology   Procedures Procedures (including critical care time)  Medications Ordered in ED Medications - No data to display   Initial Impression / Assessment and Plan / ED Course  I have reviewed the triage vital signs and the nursing notes.  Pertinent labs & imaging results that were available during my care of the patient were reviewed by me and considered in my medical decision making (see chart for details).  MDM Reviewed: previous chart, nursing note and vitals Interpretation: labs   Results for orders placed or performed during the hospital encounter of 01/30/17  Urinalysis, Routine w reflex microscopic  Result Value Ref Range   Color, Urine YELLOW YELLOW   APPearance HAZY (A) CLEAR   Specific Gravity, Urine 1.020 1.005 - 1.030   pH 5.0 5.0 - 8.0   Glucose, UA NEGATIVE NEGATIVE mg/dL    Hgb urine dipstick MODERATE (A) NEGATIVE   Bilirubin Urine NEGATIVE NEGATIVE   Ketones, ur NEGATIVE NEGATIVE mg/dL   Protein, ur NEGATIVE NEGATIVE mg/dL   Nitrite NEGATIVE NEGATIVE   Leukocytes, UA LARGE (A) NEGATIVE   RBC / HPF TOO NUMEROUS TO COUNT 0 - 5 RBC/hpf   WBC, UA TOO NUMEROUS TO COUNT 0 - 5 WBC/hpf   Bacteria, UA RARE (A) NONE SEEN   Squamous Epithelial / LPF 0-5 (A) NONE SEEN    Ct Renal Stone Study Result Date: 01/30/2017 CLINICAL DATA:  Pt reports hx of uti's. Pt reports last week having lower abdominal pain, nausea, dysuria, hematuria, urinary urgency and frequency. Pt reports symptoms came back today. EXAM: CT ABDOMEN AND PELVIS WITHOUT CONTRAST TECHNIQUE: Multidetector CT imaging of the abdomen and pelvis was performed following the standard protocol without IV contrast. COMPARISON:  CT abdomen dated 05/27/2015. FINDINGS: Lower chest: No acute abnormality. Hepatobiliary: No focal liver abnormality is seen. Status post cholecystectomy. No biliary dilatation. Pancreas: Unremarkable. No pancreatic ductal dilatation or surrounding inflammatory changes. Spleen: Normal  in size without focal abnormality. Adrenals/Urinary Tract: Adrenal glands appear normal. Kidneys appear normal without mass, stone or hydronephrosis. No perinephric inflammation. No ureteral or bladder calculi identified. Bladder is unremarkable, decompressed. Stomach/Bowel: Bowel is normal in caliber. No bowel wall thickening or evidence of bowel wall inflammation seen. Appendix is normal. Vascular/Lymphatic: No significant vascular findings are present. No enlarged abdominal or pelvic lymph nodes. Reproductive: Status post hysterectomy. No adnexal masses. Other: No free fluid or abscess collection. No free intraperitoneal air. Musculoskeletal: No acute or suspicious osseous finding. Bilateral chronic pars interarticularis defects with minimal associated spondylolisthesis. Superficial soft tissues are unremarkable. IMPRESSION:  1. No acute findings within the abdomen or pelvis. No renal or ureteral calculi. No perinephric inflammation. Bladder is decompressed limiting characterization. No free fluid or abscess. 2. 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 No acute appearing osseous abnormality. Electronically Signed   By: Bary RichardStan  Maynard M.D.   On: 01/30/2017 23:11    2340:  CT scan reassuring. Will tx for cystitis. Dx and testing d/w pt.  Questions answered.  Verb understanding, agreeable to d/c home with outpt f/u.   Final Clinical Impressions(s) / ED Diagnoses   Final diagnoses:  None    New Prescriptions New Prescriptions   No medications on file     Samuel JesterMcManus, Kentrail Shew, DO 02/02/17 [id number]

## 2017-02-02 LAB — URINE CULTURE

## 2017-02-03 ENCOUNTER — Telehealth: Payer: Self-pay | Admitting: Emergency Medicine

## 2017-02-03 NOTE — Telephone Encounter (Signed)
Post ED Visit - Positive Culture Follow-up  Culture report reviewed by antimicrobial stewardship pharmacist:  []  Enzo BiNathan Batchelder, Pharm.D. []  Celedonio MiyamotoJeremy Frens, Pharm.D., BCPS AQ-ID []  Garvin FilaMike Maccia, Pharm.D., BCPS [x]  Georgina PillionElizabeth Martin, Pharm.D., BCPS []  LongtownMinh Pham, 17[address] Rainbow BoulevardPharm.D., BCPS, AAHIVP []  Estella HuskMichelle Turner, Pharm.D., BCPS, AAHIVP []  Lysle Pearlachel Rumbarger, PharmD, BCPS []  Casilda Carlsaylor Stone, PharmD, BCPS []  Pollyann SamplesAndy Johnston, PharmD, BCPS  Positive urine culture Treated with nitrofurantoin, organism sensitive to the same and no further patient follow-up is required at this time.  Berle MullMiller, Arnisha Laffoon 02/03/2017, 2:07 PM

## 2017-03-11 ENCOUNTER — Emergency Department (HOSPITAL_COMMUNITY): Payer: Medicare Other

## 2017-03-11 ENCOUNTER — Emergency Department (HOSPITAL_COMMUNITY)
Admission: EM | Admit: 2017-03-11 | Discharge: 2017-03-11 | Disposition: A | Discharge status: Home / Self Care | Payer: Medicare Other | Attending: Emergency Medicine | Admitting: Emergency Medicine

## 2017-03-11 DIAGNOSIS — M25572 Pain in left ankle and joints of left foot: Secondary | ICD-10-CM | POA: Insufficient documentation

## 2017-03-11 DIAGNOSIS — J45909 Unspecified asthma, uncomplicated: Secondary | ICD-10-CM | POA: Diagnosis not present

## 2017-03-11 DIAGNOSIS — Z79899 Other long term (current) drug therapy: Secondary | ICD-10-CM | POA: Diagnosis not present

## 2017-03-11 NOTE — Discharge Instructions (Signed)
Wear the brace as needed for support. Elevate your foot when possible. Ibuprofen every 6-8 hours as needed. Call the orthopedic doctor listed to arrange a follow-up appointment in one week if not improving.

## 2017-03-11 NOTE — ED Triage Notes (Signed)
Left ankle pain

## 2017-03-12 NOTE — ED Provider Notes (Signed)
[facility] Provider Note   CSN: [id number] Arrival date & time: 03/11/17  1643     History   Chief Complaint Chief Complaint  Patient presents with  . Ankle Pain    HPI Melanie Proctor is a 46 y.o. female.  HPI  Melanie Fallenesheka Bodenheimer is a 46 y.o. female who presents to the Emergency Department complaining of left ankle pain for one month.  Pain began after a twisting injury to the ankle.  She has continued to walk and bear weight to the ankle, but continues to have pain that has not improved.  She denies swelling, redness or numbness, and pain or swelling proximal to the ankle.  She has tried OTC pain relievers without relief.   Past Medical History:  Diagnosis Date  . Anxiety   . Asthma   . Back pain   . Bipolar disorder (HCC)   . Gastric ulcer   . GERD (gastroesophageal reflux disease)   . Herpes   . History of frequent urinary tract infections   . Knee pain, chronic   . Migraine     There are no active problems to display for this patient.   Past Surgical History:  Procedure Laterality Date  . ABDOMINAL HYSTERECTOMY    . CERVICAL CONE BIOPSY    . CHOLECYSTECTOMY    . INSERTION OF MESH     vaginal  . KNEE SURGERY    . TUBAL LIGATION      OB History    No data available       Home Medications    Prior to Admission medications   Medication Sig Start Date End Date Taking? Authorizing Provider  albuterol (PROVENTIL HFA;VENTOLIN HFA) 108 (90 Base) MCG/ACT inhaler Inhale 2 puffs into the lungs every 4 (four) hours as needed.    [provider]  Aspirin-Acetaminophen-Caffeine (GOODY HEADACHE PO) Take 1 packet by mouth every 6 (six) hours as needed (headaches).    [provider]  citalopram (CELEXA) 40 MG tablet Take 40 mg by mouth daily. 01/05/16   [provider]  ketoconazole (NIZORAL) 2 % shampoo Apply 1 application topically 2 (two) times a week. 05/02/15   Burgess AmorIdol, Julie, PA-C  nitrofurantoin, macrocrystal-monohydrate, (MACROBID)  100 MG capsule Take 1 capsule (100 mg total) by mouth 2 (two) times daily. 01/30/17   Samuel JesterMcManus, Kathleen, DO  NUCYNTA 75 MG tablet Take 1 tablet by mouth 2 (two) times daily. 05/29/16   [provider]  omeprazole (PRILOSEC) 40 MG capsule Take 40 mg by mouth daily. 01/05/16   [provider]  rizatriptan (MAXALT) 10 MG tablet Take 1 tablet (10 mg total) by mouth as needed for migraine. May repeat in 2 hours if needed 05/02/15   Idol, Raynelle FanningJulie, PA-C  valACYclovir (VALTREX) 1000 MG tablet Take 1 g by mouth daily. 01/03/15   [provider]    Family History No family history on file.  Social History Social History  Substance Use Topics  . Smoking status: Never Smoker  . Smokeless tobacco: Never Used  . Alcohol use No     Comment: ocassional      Allergies   Amoxicillin; Azithromycin; Other; Topamax [topiramate]; Ciprofloxacin; Penicillins; Sulfa antibiotics; and Tape   Review of Systems Review of Systems  Constitutional: Negative for chills and fever.  Musculoskeletal: Positive for arthralgias (left ankle pain). Negative for joint swelling.  Skin: Negative for color change and wound.  Neurological: Negative for weakness and numbness.  All other systems reviewed and are negative.  Physical Exam Updated Vital Signs BP 139/71 (BP Location: Right Arm)   Pulse 90   Temp 98.1 F (36.7 C) (Oral)   Resp 18   Ht 5\' 4"  (1.626 m)   Wt 73.5 kg (162 lb)   SpO2 97%   BMI 27.81 kg/m   Physical Exam  Constitutional: She is oriented to person, place, and time. She appears well-developed and well-nourished. No distress.  HENT:  Head: Normocephalic and atraumatic.  Cardiovascular: Normal rate, regular rhythm and intact distal pulses.   Pulmonary/Chest: Effort normal and breath sounds normal.  Musculoskeletal: She exhibits tenderness. She exhibits no edema or deformity.  ttp of the anterolateral left ankle.  No edema, erythema, abrasion, bruising or bony  deformity.  No proximal tenderness.  Compartments are soft  Neurological: She is alert and oriented to person, place, and time. No sensory deficit. She exhibits normal muscle tone. Coordination normal.  Skin: Skin is warm and dry. Capillary refill takes less than 2 seconds. No rash noted.  Nursing note and vitals reviewed.    ED Treatments / Results  Labs (all labs ordered are listed, but only abnormal results are displayed) Labs Reviewed - No data to display  EKG  EKG Interpretation None       Radiology Dg Ankle Complete Left  Result Date: 03/11/2017 CLINICAL DATA:  46 year old female with lateral left ankle pain. She has had pain since her dog ran into her ankle on July 5th EXAM: LEFT ANKLE COMPLETE - 3+ VIEW COMPARISON:  None. FINDINGS: There is no evidence of fracture, dislocation, or joint effusion. There is no evidence of arthropathy or other focal bone abnormality. Soft tissues are unremarkable. IMPRESSION: Negative. Electronically Signed   By: Malachy MoanHeath  McCullough M.D.   On: 03/11/2017 17:29    Procedures Procedures (including critical care time)  Medications Ordered in ED Medications - No data to display   Initial Impression / Assessment and Plan / ED Course  I have reviewed the triage vital signs and the nursing notes.  Pertinent labs & imaging results that were available during my care of the patient were reviewed by me and considered in my medical decision making (see chart for details).     XR reviewed.  NV intact.  No concerning sx's for infection.  Pt ambulates with steady gait.   ASO applied, pain improved.  Referral info given for orthopedics.   Final Clinical Impressions(s) / ED Diagnoses   Final diagnoses:  Acute left ankle pain    New Prescriptions Discharge Medication List as of 03/11/2017  5:47 PM       Pauline Ausriplett, Yerik Zeringue, PA-C 03/12/17 2325    Doug SouJacubowitz, Sam, MD 03/12/17 2358

## 2019-05-08 IMAGING — CT CT RENAL STONE PROTOCOL
2 of 3 series · 16 of 46 positions shown, 18 images · non-contrast
Comparison: CT abdomen dated 05/27/2015.

CLINICAL DATA: Pt reports hx of uti's. Pt reports last week having
lower abdominal pain, nausea, dysuria, hematuria, urinary urgency
and frequency. Pt reports symptoms came back today.

EXAM:
CT ABDOMEN AND PELVIS WITHOUT CONTRAST
TECHNIQUE: Multidetector CT imaging of the abdomen and pelvis was performed
following the standard protocol without IV contrast.

[Series 2: axial st · axial · 0.79mm/px · z∈[-337,+38]mm · 13 of 87 slices shown, 15 images]
[im 6/87  soft-tissue]
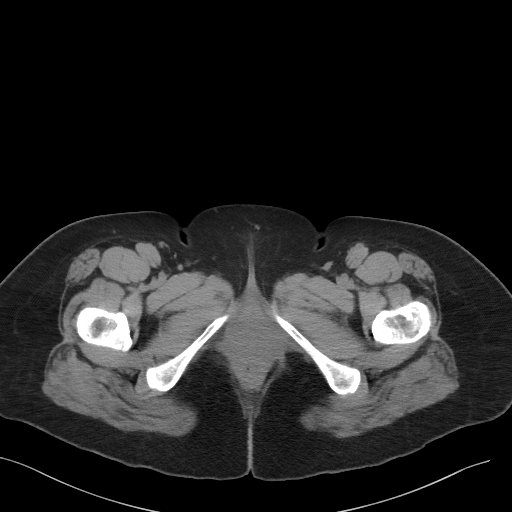
[im 6/87  bone]
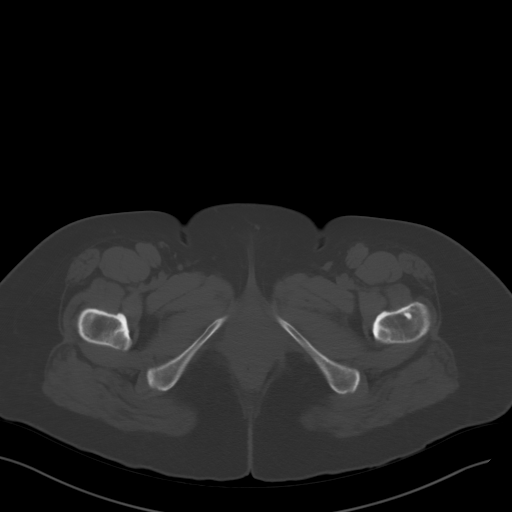
[im 12/87  soft-tissue]
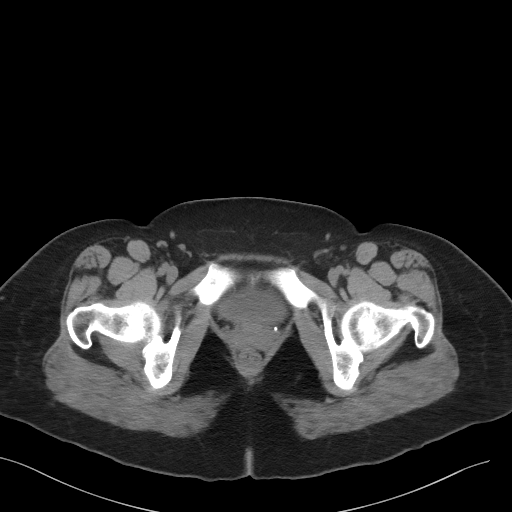
[im 17/87  soft-tissue]
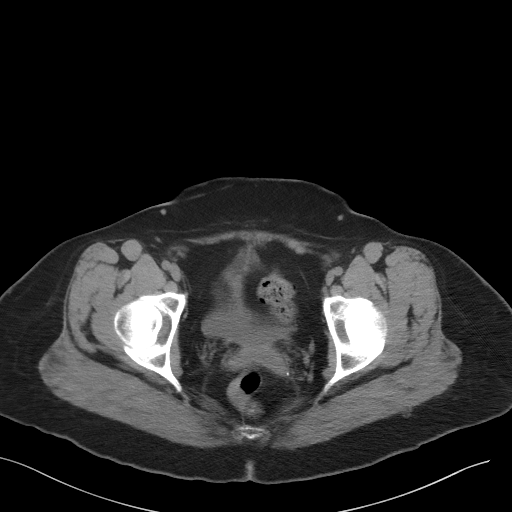
[im 25/87  soft-tissue]
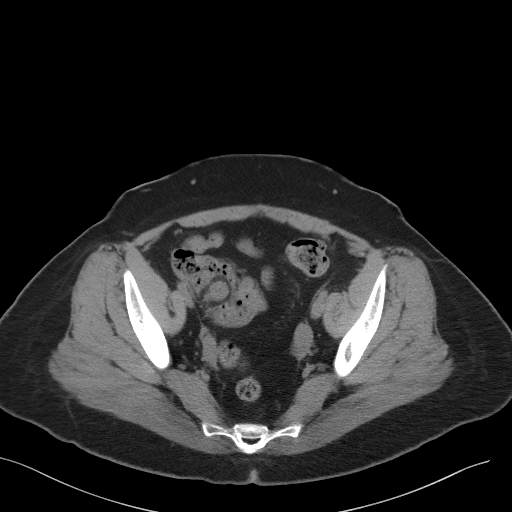
[im 31/87  soft-tissue]
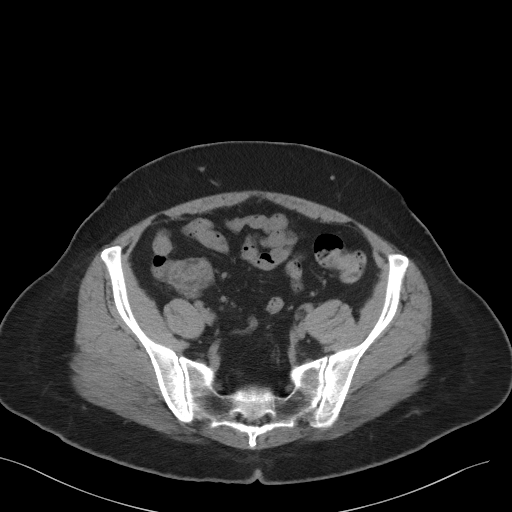
[im 37/87  soft-tissue]
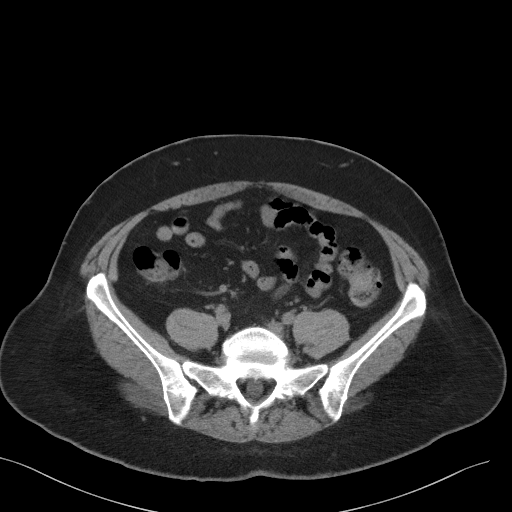
[im 45/87  soft-tissue]
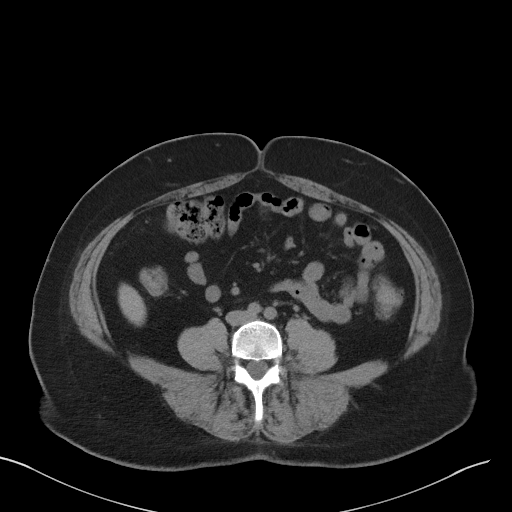
[im 50/87  soft-tissue]
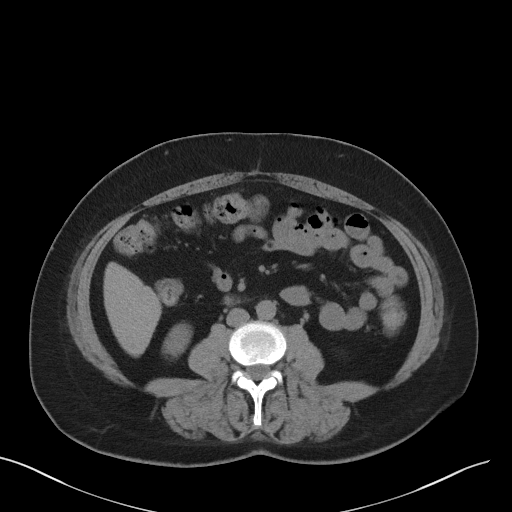
[im 56/87  soft-tissue]
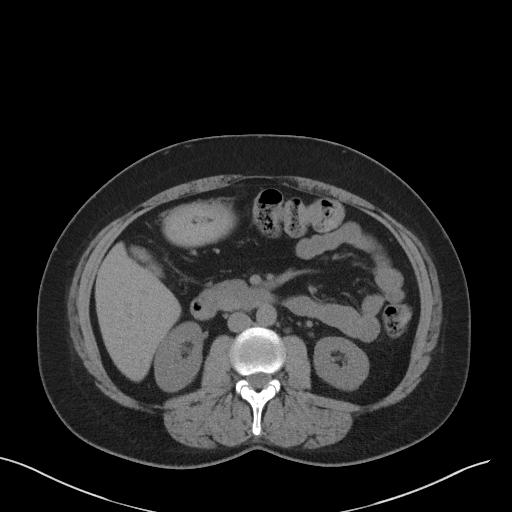
[im 56/87  bone]
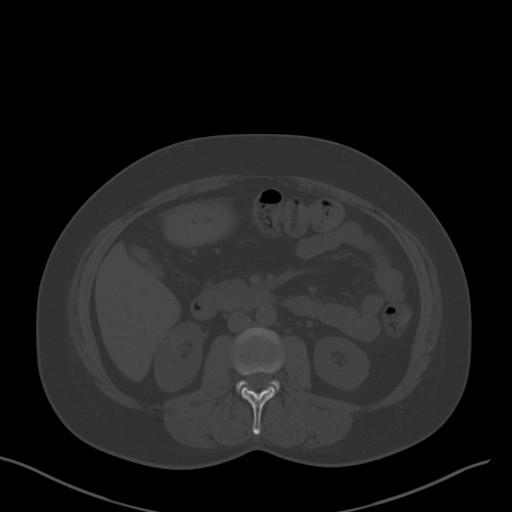
[im 62/87  soft-tissue]
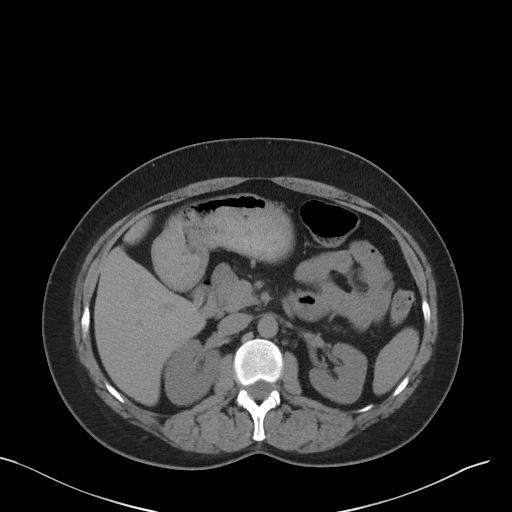
[im 70/87  soft-tissue]
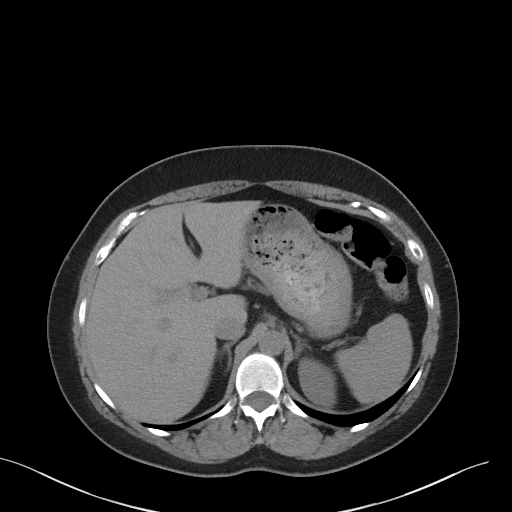
[im 75/87  soft-tissue]
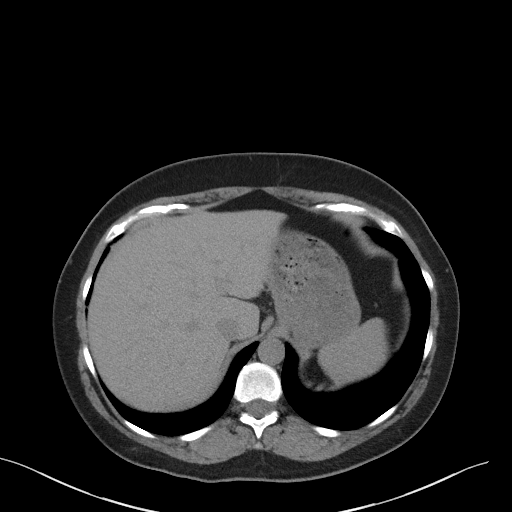
[im 81/87  soft-tissue]
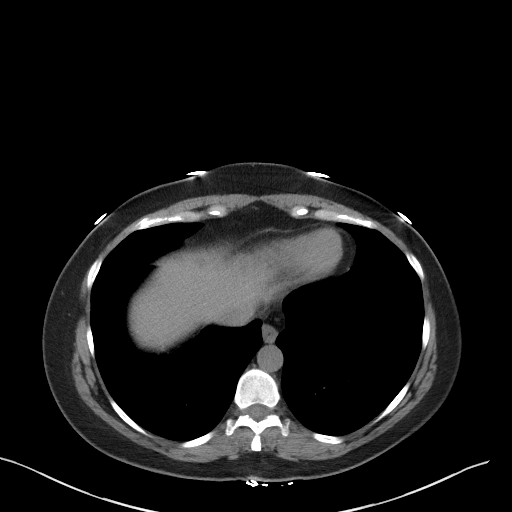

[Series 5: coronal st · coronal · 0.72mm/px · 3 of 101 slices shown]
[im 34/101  soft-tissue]
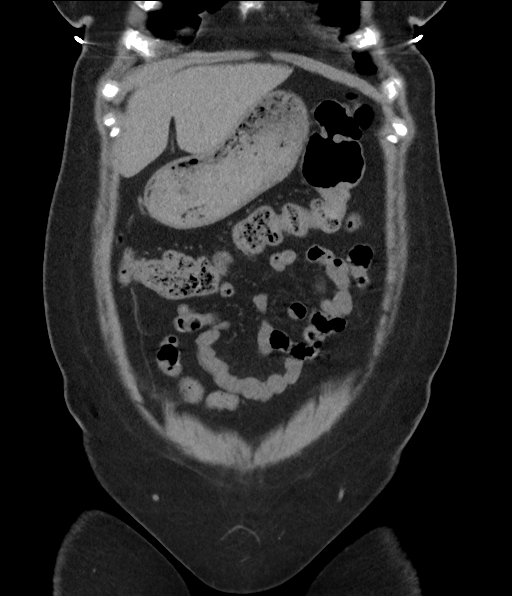
[im 45/101  soft-tissue]
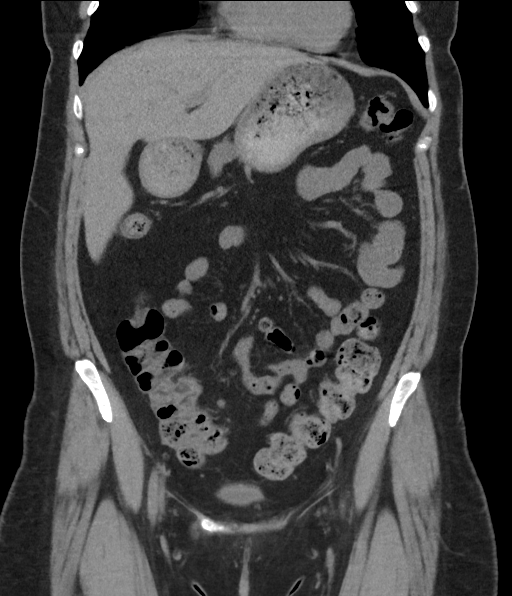
[im 56/101  soft-tissue]
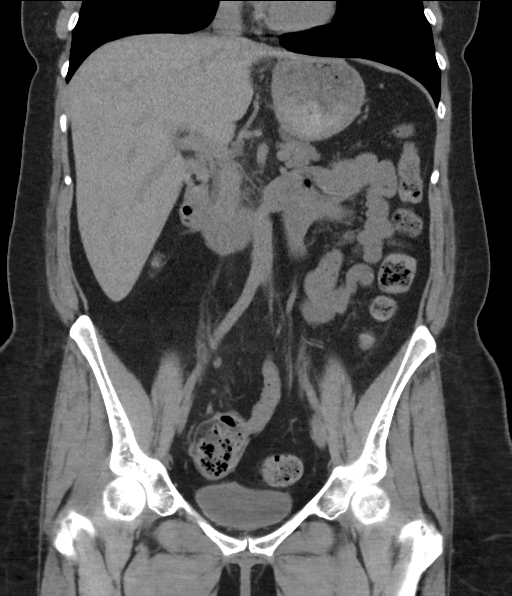

[16 of 46 positions shown; findings below may reference images not displayed]

FINDINGS: Lower chest: No acute abnormality.

Hepatobiliary: No focal liver abnormality is seen. Status post
cholecystectomy. No biliary dilatation.

Pancreas: Unremarkable. No pancreatic ductal dilatation or
surrounding inflammatory changes.

Spleen: Normal in size without focal abnormality.

Adrenals/Urinary Tract: Adrenal glands appear normal. Kidneys appear
normal without mass, stone or hydronephrosis. No perinephric
inflammation. No ureteral or bladder calculi identified. Bladder is
unremarkable, decompressed.

Stomach/Bowel: Bowel is normal in caliber. No bowel wall thickening
or evidence of bowel wall inflammation seen. Appendix is normal.

Vascular/Lymphatic: No significant vascular findings are present. No
enlarged abdominal or pelvic lymph nodes.

Reproductive: Status post hysterectomy. No adnexal masses.

Other: No free fluid or abscess collection. No free intraperitoneal
air.

Musculoskeletal: No acute or suspicious osseous finding. Bilateral
chronic pars interarticularis defects with minimal associated
spondylolisthesis. Superficial soft tissues are unremarkable.
IMPRESSION: 1. No acute findings within the abdomen or pelvis. No renal or
ureteral calculi. No perinephric inflammation. Bladder is
decompressed limiting characterization. No free fluid or abscess.
2. Chronic bilateral pars interarticularis defects at L5-S1, with
associated minimal grade 1 spondylolisthesis. No acute appearing
osseous abnormality.
